# Patient Record
Sex: Male | Born: 1955
Health system: Southern US, Community
[De-identification: ages and names within clinical notes are randomized; demographics above are authoritative.]

## PROBLEM LIST (undated history)

## (undated) DIAGNOSIS — E559 Vitamin D deficiency, unspecified: Secondary | ICD-10-CM

## (undated) DIAGNOSIS — J939 Pneumothorax, unspecified: Secondary | ICD-10-CM

## (undated) DIAGNOSIS — R5383 Other fatigue: Secondary | ICD-10-CM

## (undated) DIAGNOSIS — N3281 Overactive bladder: Secondary | ICD-10-CM

## (undated) DIAGNOSIS — I319 Disease of pericardium, unspecified: Secondary | ICD-10-CM

## (undated) DIAGNOSIS — R972 Elevated prostate specific antigen [PSA]: Secondary | ICD-10-CM

## (undated) DIAGNOSIS — H538 Other visual disturbances: Secondary | ICD-10-CM

## (undated) DIAGNOSIS — I213 ST elevation (STEMI) myocardial infarction of unspecified site: Secondary | ICD-10-CM

## (undated) DIAGNOSIS — I10 Essential (primary) hypertension: Secondary | ICD-10-CM

## (undated) DIAGNOSIS — E785 Hyperlipidemia, unspecified: Secondary | ICD-10-CM

## (undated) DIAGNOSIS — C61 Malignant neoplasm of prostate: Secondary | ICD-10-CM

## (undated) DIAGNOSIS — R3915 Urgency of urination: Secondary | ICD-10-CM

## (undated) DIAGNOSIS — M1990 Unspecified osteoarthritis, unspecified site: Secondary | ICD-10-CM

## (undated) DIAGNOSIS — R079 Chest pain, unspecified: Secondary | ICD-10-CM

## (undated) DIAGNOSIS — Z720 Tobacco use: Secondary | ICD-10-CM

## (undated) DIAGNOSIS — R319 Hematuria, unspecified: Secondary | ICD-10-CM

## (undated) HISTORY — DX: Unspecified osteoarthritis, unspecified site: M19.90

## (undated) HISTORY — DX: Urgency of urination: R39.15

## (undated) HISTORY — DX: ST elevation (STEMI) myocardial infarction of unspecified site: I21.3

## (undated) HISTORY — DX: Essential (primary) hypertension: I10

## (undated) HISTORY — PX: WISDOM TOOTH EXTRACTION: SHX21

## (undated) HISTORY — DX: Hyperlipidemia, unspecified: E78.5

## (undated) HISTORY — PX: PROSTATE BIOPSY: SHX241

## (undated) HISTORY — DX: Tobacco use: Z72.0

## (undated) HISTORY — PX: DENTAL SURGERY: SHX609

## (undated) HISTORY — DX: Hematuria, unspecified: R31.9

## (undated) HISTORY — DX: Chest pain, unspecified: R07.9

## (undated) HISTORY — DX: Overactive bladder: N32.81

## (undated) HISTORY — DX: Other visual disturbances: H53.8

## (undated) HISTORY — DX: Elevated prostate specific antigen (PSA): R97.20

## (undated) HISTORY — DX: Vitamin D deficiency, unspecified: E55.9

## (undated) HISTORY — DX: Other fatigue: R53.83

---

## 1984-03-02 DIAGNOSIS — J939 Pneumothorax, unspecified: Secondary | ICD-10-CM

## 1984-03-02 HISTORY — DX: Pneumothorax, unspecified: J93.9

## 2016-03-11 DIAGNOSIS — Z0000 Encounter for general adult medical examination without abnormal findings: Secondary | ICD-10-CM | POA: Diagnosis not present

## 2016-03-11 DIAGNOSIS — Z136 Encounter for screening for cardiovascular disorders: Secondary | ICD-10-CM | POA: Diagnosis not present

## 2016-03-11 DIAGNOSIS — Z131 Encounter for screening for diabetes mellitus: Secondary | ICD-10-CM | POA: Diagnosis not present

## 2016-03-11 DIAGNOSIS — Z720 Tobacco use: Secondary | ICD-10-CM | POA: Diagnosis not present

## 2016-03-11 DIAGNOSIS — N3281 Overactive bladder: Secondary | ICD-10-CM | POA: Diagnosis not present

## 2016-03-11 DIAGNOSIS — Z01118 Encounter for examination of ears and hearing with other abnormal findings: Secondary | ICD-10-CM | POA: Diagnosis not present

## 2016-03-11 DIAGNOSIS — R319 Hematuria, unspecified: Secondary | ICD-10-CM | POA: Diagnosis not present

## 2016-03-11 DIAGNOSIS — H538 Other visual disturbances: Secondary | ICD-10-CM | POA: Diagnosis not present

## 2016-03-11 DIAGNOSIS — R5383 Other fatigue: Secondary | ICD-10-CM | POA: Diagnosis not present

## 2016-03-11 LAB — LIPID PANEL
Cholesterol: 169 (ref 0–200)
HDL: 50 (ref 35–70)
LDL Cholesterol: 102
Triglycerides: 84 (ref 40–160)

## 2016-03-11 LAB — BASIC METABOLIC PANEL
BUN: 11 (ref 4–21)
Creatinine: 0.8 (ref 0.6–1.3)
GLUCOSE: 79
Potassium: 4.4 (ref 3.4–5.3)
Sodium: 143 (ref 137–147)

## 2016-03-11 LAB — HEPATIC FUNCTION PANEL
ALK PHOS: 73 (ref 25–125)
ALT: 20 (ref 10–40)
AST: 18 (ref 14–40)

## 2016-03-11 LAB — HM HIV SCREENING LAB: HM HIV SCREENING: NEGATIVE

## 2016-03-11 LAB — TSH: TSH: 1.15 (ref 0.41–5.90)

## 2016-03-11 LAB — HM HEPATITIS C SCREENING LAB: HM HEPATITIS C SCREENING: NEGATIVE

## 2016-03-11 LAB — CBC AND DIFFERENTIAL
NEUTROS ABS: 3
WBC: 6.8

## 2016-03-11 LAB — VITAMIN D 25 HYDROXY (VIT D DEFICIENCY, FRACTURES): VIT D 25 HYDROXY: 12.2

## 2016-03-11 LAB — PSA: PSA: 98

## 2016-03-11 LAB — HEMOGLOBIN A1C: HEMOGLOBIN A1C: 5.5

## 2016-04-07 DIAGNOSIS — N3281 Overactive bladder: Secondary | ICD-10-CM | POA: Diagnosis not present

## 2016-04-07 DIAGNOSIS — R972 Elevated prostate specific antigen [PSA]: Secondary | ICD-10-CM | POA: Diagnosis not present

## 2016-04-07 DIAGNOSIS — E785 Hyperlipidemia, unspecified: Secondary | ICD-10-CM | POA: Diagnosis not present

## 2016-04-07 DIAGNOSIS — I10 Essential (primary) hypertension: Secondary | ICD-10-CM | POA: Diagnosis not present

## 2016-04-07 DIAGNOSIS — E559 Vitamin D deficiency, unspecified: Secondary | ICD-10-CM | POA: Diagnosis not present

## 2016-04-07 DIAGNOSIS — Z720 Tobacco use: Secondary | ICD-10-CM | POA: Diagnosis not present

## 2016-08-28 ENCOUNTER — Encounter (HOSPITAL_COMMUNITY)
Admission: EM | Disposition: A | Discharge status: Home / Self Care | Payer: Self-pay | Source: Home / Self Care | Attending: Cardiology

## 2016-08-28 ENCOUNTER — Emergency Department (HOSPITAL_COMMUNITY): Payer: Medicare HMO

## 2016-08-28 ENCOUNTER — Observation Stay (HOSPITAL_COMMUNITY): Payer: Medicare HMO

## 2016-08-28 ENCOUNTER — Encounter (HOSPITAL_COMMUNITY): Payer: Self-pay | Admitting: Emergency Medicine

## 2016-08-28 ENCOUNTER — Observation Stay (HOSPITAL_BASED_OUTPATIENT_CLINIC_OR_DEPARTMENT_OTHER): Payer: Medicare HMO

## 2016-08-28 ENCOUNTER — Observation Stay (HOSPITAL_COMMUNITY)
Admission: EM | Admit: 2016-08-28 | Discharge: 2016-08-28 | Disposition: A | Discharge status: Home / Self Care | Payer: Medicare HMO | Attending: Cardiology | Admitting: Cardiology

## 2016-08-28 DIAGNOSIS — R079 Chest pain, unspecified: Secondary | ICD-10-CM | POA: Diagnosis not present

## 2016-08-28 DIAGNOSIS — I200 Unstable angina: Secondary | ICD-10-CM

## 2016-08-28 DIAGNOSIS — R69 Illness, unspecified: Secondary | ICD-10-CM | POA: Diagnosis not present

## 2016-08-28 DIAGNOSIS — I319 Disease of pericardium, unspecified: Principal | ICD-10-CM

## 2016-08-28 DIAGNOSIS — I213 ST elevation (STEMI) myocardial infarction of unspecified site: Secondary | ICD-10-CM | POA: Diagnosis present

## 2016-08-28 DIAGNOSIS — F17210 Nicotine dependence, cigarettes, uncomplicated: Secondary | ICD-10-CM | POA: Diagnosis not present

## 2016-08-28 DIAGNOSIS — R072 Precordial pain: Secondary | ICD-10-CM

## 2016-08-28 DIAGNOSIS — R7989 Other specified abnormal findings of blood chemistry: Secondary | ICD-10-CM

## 2016-08-28 HISTORY — PX: LEFT HEART CATH AND CORONARY ANGIOGRAPHY: CATH118249

## 2016-08-28 HISTORY — DX: Pneumothorax, unspecified: J93.9

## 2016-08-28 HISTORY — DX: Disease of pericardium, unspecified: I31.9

## 2016-08-28 HISTORY — DX: Chest pain, unspecified: R07.9

## 2016-08-28 HISTORY — DX: ST elevation (STEMI) myocardial infarction of unspecified site: I21.3

## 2016-08-28 LAB — BASIC METABOLIC PANEL
Anion gap: 8 (ref 5–15)
Anion gap: 8 (ref 5–15)
BUN: 14 mg/dL (ref 6–20)
BUN: 10 mg/dL (ref 6–20)
CALCIUM: 9.2 mg/dL (ref 8.9–10.3)
CO2: 24 mmol/L (ref 22–32)
CO2: 22 mmol/L (ref 22–32)
CREATININE: 0.88 mg/dL (ref 0.61–1.24)
CREATININE: 0.74 mg/dL (ref 0.61–1.24)
Calcium: 9.1 mg/dL (ref 8.9–10.3)
Chloride: 109 mmol/L (ref 101–111)
Chloride: 108 mmol/L (ref 101–111)
GFR calc non Af Amer: >60 mL/min (ref >60)
GFR calc non Af Amer: >60 mL/min (ref >60)
Glucose, Bld: 110 mg/dL — ABNORMAL HIGH (ref 65–99)
Glucose, Bld: 112 mg/dL — ABNORMAL HIGH (ref 65–99)
POTASSIUM: 3.8 mmol/L (ref 3.5–5.1)
Potassium: 4.2 mmol/L (ref 3.5–5.1)
SODIUM: 141 mmol/L (ref 135–145)
SODIUM: 138 mmol/L (ref 135–145)

## 2016-08-28 LAB — CBC
HCT: 42.7 % (ref 39.0–52.0)
HEMATOCRIT: 40.6 % (ref 39.0–52.0)
Hemoglobin: 14.7 g/dL (ref 13.0–17.0)
Hemoglobin: 13.9 g/dL (ref 13.0–17.0)
MCH: 32 pg (ref 26.0–34.0)
MCH: 32.5 pg (ref 26.0–34.0)
MCHC: 34.4 g/dL (ref 30.0–36.0)
MCHC: 34.2 g/dL (ref 30.0–36.0)
MCV: 92.8 fL (ref 78.0–100.0)
MCV: 94.9 fL (ref 78.0–100.0)
PLATELETS: 255 10*3/uL (ref 150–400)
Platelets: 229 10*3/uL (ref 150–400)
RBC: 4.6 MIL/uL (ref 4.22–5.81)
RBC: 4.28 MIL/uL (ref 4.22–5.81)
RDW: 14.3 % (ref 11.5–15.5)
RDW: 14.5 % (ref 11.5–15.5)
WBC: 11.4 10*3/uL — ABNORMAL HIGH (ref 4.0–10.5)
WBC: 10.4 10*3/uL (ref 4.0–10.5)

## 2016-08-28 LAB — ECHOCARDIOGRAM COMPLETE
Height: 75 in
Weight: 3520 oz

## 2016-08-28 LAB — C-REACTIVE PROTEIN: CRP: 1.1 mg/dL — ABNORMAL HIGH (ref <1.0)

## 2016-08-28 LAB — CK TOTAL AND CKMB (NOT AT ARMC)
CK, MB: 1.4 ng/mL (ref 0.5–5.0)
Relative Index: 1.1 (ref 0.0–2.5)
Total CK: 132 U/L (ref 49–397)

## 2016-08-28 LAB — SEDIMENTATION RATE: Sed Rate: 24 mm/hr — ABNORMAL HIGH (ref 0–16)

## 2016-08-28 LAB — PROTIME-INR
INR: 0.93
Prothrombin Time: 12.5 seconds (ref 11.4–15.2)

## 2016-08-28 LAB — LIPID PANEL
CHOL/HDL RATIO: 3.2 ratio
Cholesterol: 172 mg/dL (ref 0–200)
HDL: 53 mg/dL (ref >40)
LDL CALC: 106 mg/dL — AB (ref 0–99)
TRIGLYCERIDES: 63 mg/dL (ref <150)
VLDL: 13 mg/dL (ref 0–40)

## 2016-08-28 LAB — I-STAT TROPONIN, ED: TROPONIN I, POC: 0 ng/mL (ref 0.00–0.08)

## 2016-08-28 LAB — POCT ACTIVATED CLOTTING TIME: Activated Clotting Time: 158 seconds

## 2016-08-28 LAB — D-DIMER, QUANTITATIVE (NOT AT ARMC): D DIMER QUANT: 15.41 ug{FEU}/mL — AB (ref 0.00–0.50)

## 2016-08-28 LAB — TROPONIN I
Troponin I: <0.03 ng/mL (ref <0.03)
Troponin I: <0.03 ng/mL (ref <0.03)

## 2016-08-28 LAB — HIV ANTIBODY (ROUTINE TESTING W REFLEX): HIV SCREEN 4TH GENERATION: NONREACTIVE

## 2016-08-28 LAB — APTT: aPTT: 37 seconds — ABNORMAL HIGH (ref 24–36)

## 2016-08-28 SURGERY — LEFT HEART CATH AND CORONARY ANGIOGRAPHY
Anesthesia: LOCAL

## 2016-08-28 MED ORDER — SODIUM CHLORIDE 0.9% FLUSH: 3.0000 mL | Freq: Two times a day (BID) | INTRAVENOUS | Status: DC

## 2016-08-28 MED ORDER — ONDANSETRON HCL 4 MG/2ML IJ SOLN: 4.0000 mg | Freq: Four times a day (QID) | INTRAMUSCULAR | Status: DC | PRN

## 2016-08-28 MED ORDER — NICOTINE 14 MG/24HR TD PT24
14.0000 mg | MEDICATED_PATCH | Freq: Every day | TRANSDERMAL | Status: DC
Start: 2016-08-28 — End: 2016-08-28
  Administered 2016-08-28: 14 mg via TRANSDERMAL
  Filled 2016-08-28: qty 1

## 2016-08-28 MED ORDER — IOPAMIDOL (ISOVUE-370) INJECTION 76%
INTRAVENOUS | Status: AC
Start: 2016-08-28 — End: ?
  Filled 2016-08-28: qty 125

## 2016-08-28 MED ORDER — MIDAZOLAM HCL 2 MG/2ML IJ SOLN
INTRAMUSCULAR | Status: DC | PRN
  Administered 2016-08-28: 2 mg via INTRAVENOUS

## 2016-08-28 MED ORDER — PANTOPRAZOLE SODIUM 40 MG PO TBEC
40.0000 mg | DELAYED_RELEASE_TABLET | Freq: Every day | ORAL | Status: DC
  Administered 2016-08-28: 40 mg via ORAL
  Filled 2016-08-28: qty 1

## 2016-08-28 MED ORDER — MIDAZOLAM HCL 2 MG/2ML IJ SOLN
INTRAMUSCULAR | Status: AC
  Filled 2016-08-28: qty 2

## 2016-08-28 MED ORDER — NICOTINE 7 MG/24HR TD PT24
7.0000 mg | MEDICATED_PATCH | Freq: Every day | TRANSDERMAL | Status: DC
  Filled 2016-08-28: qty 1

## 2016-08-28 MED ORDER — COLCHICINE 0.6 MG PO TABS: 0.6000 mg | ORAL_TABLET | Freq: Two times a day (BID) | ORAL | 3 refills | Status: DC

## 2016-08-28 MED ORDER — FENTANYL CITRATE (PF) 100 MCG/2ML IJ SOLN
INTRAMUSCULAR | Status: DC | PRN
  Administered 2016-08-28: 25 ug via INTRAVENOUS

## 2016-08-28 MED ORDER — HEPARIN SODIUM (PORCINE) 5000 UNIT/ML IJ SOLN
4000.0000 [IU] | Freq: Once | INTRAMUSCULAR | Status: AC
  Administered 2016-08-28: 4000 [IU] via INTRAVENOUS
  Filled 2016-08-28: qty 0.8

## 2016-08-28 MED ORDER — TECHNETIUM TO 99M ALBUMIN AGGREGATED
4.0000 | Freq: Once | INTRAVENOUS | Status: AC | PRN
  Administered 2016-08-28: 4 via INTRAVENOUS

## 2016-08-28 MED ORDER — NITROGLYCERIN 2 % TD OINT
1.0000 [in_us] | TOPICAL_OINTMENT | Freq: Once | TRANSDERMAL | Status: AC
  Administered 2016-08-28: 1 [in_us] via TOPICAL
  Filled 2016-08-28: qty 2

## 2016-08-28 MED ORDER — SODIUM CHLORIDE 0.9% FLUSH
3.0000 mL | Freq: Two times a day (BID) | INTRAVENOUS | Status: DC
  Administered 2016-08-28: 3 mL via INTRAVENOUS

## 2016-08-28 MED ORDER — IOPAMIDOL (ISOVUE-370) INJECTION 76%
INTRAVENOUS | Status: DC | PRN
  Administered 2016-08-28: 75 mL via INTRA_ARTERIAL

## 2016-08-28 MED ORDER — SODIUM CHLORIDE 0.9 % IV SOLN
INTRAVENOUS | Status: DC
  Administered 2016-08-28: 03:00:00 via INTRAVENOUS

## 2016-08-28 MED ORDER — FENTANYL CITRATE (PF) 100 MCG/2ML IJ SOLN
INTRAMUSCULAR | Status: AC
  Filled 2016-08-28: qty 2

## 2016-08-28 MED ORDER — VERAPAMIL HCL 2.5 MG/ML IV SOLN
INTRAVENOUS | Status: DC | PRN
  Administered 2016-08-28: 10 mL via INTRA_ARTERIAL

## 2016-08-28 MED ORDER — IBUPROFEN 800 MG PO TABS
800.0000 mg | ORAL_TABLET | Freq: Three times a day (TID) | ORAL | Status: DC
  Administered 2016-08-28 (×2): 800 mg via ORAL
  Filled 2016-08-28 (×2): qty 1

## 2016-08-28 MED ORDER — OXYCODONE-ACETAMINOPHEN 5-325 MG PO TABS: 1.0000 | ORAL_TABLET | ORAL | Status: DC | PRN

## 2016-08-28 MED ORDER — SODIUM CHLORIDE 0.9 % IV SOLN: 250.0000 mL | INTRAVENOUS | Status: DC | PRN

## 2016-08-28 MED ORDER — IBUPROFEN 800 MG PO TABS: 800.0000 mg | ORAL_TABLET | Freq: Three times a day (TID) | ORAL | 0 refills | Status: DC

## 2016-08-28 MED ORDER — SODIUM CHLORIDE 0.9 % IV SOLN
INTRAVENOUS | Status: AC
  Administered 2016-08-28: 75 mL/h via INTRAVENOUS

## 2016-08-28 MED ORDER — VERAPAMIL HCL 2.5 MG/ML IV SOLN
INTRAVENOUS | Status: AC
  Filled 2016-08-28: qty 2

## 2016-08-28 MED ORDER — ACETAMINOPHEN 325 MG PO TABS: 650.0000 mg | ORAL_TABLET | ORAL | Status: DC | PRN

## 2016-08-28 MED ORDER — ASPIRIN 81 MG PO CHEW
324.0000 mg | CHEWABLE_TABLET | Freq: Once | ORAL | Status: AC
  Administered 2016-08-28: 324 mg via ORAL
  Filled 2016-08-28: qty 4

## 2016-08-28 MED ORDER — SODIUM CHLORIDE 0.9% FLUSH: 3.0000 mL | INTRAVENOUS | Status: DC | PRN

## 2016-08-28 MED ORDER — COLCHICINE 0.6 MG PO TABS
0.6000 mg | ORAL_TABLET | Freq: Two times a day (BID) | ORAL | Status: DC
  Administered 2016-08-28: 0.6 mg via ORAL
  Filled 2016-08-28: qty 1

## 2016-08-28 MED ORDER — HEPARIN (PORCINE) IN NACL 2-0.9 UNIT/ML-% IJ SOLN
INTRAMUSCULAR | Status: AC | PRN
  Administered 2016-08-28: 1000 mL

## 2016-08-28 MED ORDER — LIDOCAINE HCL (PF) 1 % IJ SOLN
INTRAMUSCULAR | Status: AC
  Filled 2016-08-28: qty 30

## 2016-08-28 MED ORDER — LIDOCAINE HCL (PF) 1 % IJ SOLN
INTRAMUSCULAR | Status: DC | PRN
  Administered 2016-08-28: 18 mL
  Administered 2016-08-28: 2 mL

## 2016-08-28 MED ORDER — PANTOPRAZOLE SODIUM 40 MG PO TBEC: 40.0000 mg | DELAYED_RELEASE_TABLET | Freq: Every day | ORAL | 3 refills | Status: DC

## 2016-08-28 MED ORDER — ACETAMINOPHEN 325 MG PO TABS
650.0000 mg | ORAL_TABLET | ORAL | Status: DC | PRN
Start: 2016-08-28 — End: 2016-08-28

## 2016-08-28 MED ORDER — HEPARIN (PORCINE) IN NACL 2-0.9 UNIT/ML-% IJ SOLN
INTRAMUSCULAR | Status: AC
  Filled 2016-08-28: qty 1000

## 2016-08-28 MED ORDER — TECHNETIUM TC 99M DIETHYLENETRIAME-PENTAACETIC ACID: 30.0000 | Freq: Once | INTRAVENOUS | Status: DC | PRN

## 2016-08-28 SURGICAL SUPPLY — 15 items
CATH 5FR JL3.5 JR4 ANG PIG MP (CATHETERS) ×2 IMPLANT
CATH INFINITI 5FR JL4 (CATHETERS) ×2 IMPLANT
DEVICE RAD COMP TR BAND LRG (VASCULAR PRODUCTS) ×2 IMPLANT
GLIDESHEATH SLEND SS 6F .021 (SHEATH) ×2 IMPLANT
GUIDEWIRE INQWIRE 1.5J.035X260 (WIRE) ×1 IMPLANT
INQWIRE 1.5J .035X260CM (WIRE) ×2
KIT ENCORE 26 ADVANTAGE (KITS) ×2 IMPLANT
KIT HEART LEFT (KITS) ×2 IMPLANT
PACK CARDIAC CATHETERIZATION (CUSTOM PROCEDURE TRAY) ×2 IMPLANT
PAD ELECT DEFIB RADIOL ZOLL (MISCELLANEOUS) ×2 IMPLANT
SHEATH PINNACLE 5F 10CM (SHEATH) ×2 IMPLANT
SYR MEDRAD MARK V 150ML (SYRINGE) ×2 IMPLANT
TRANSDUCER W/STOPCOCK (MISCELLANEOUS) ×2 IMPLANT
TUBING CIL FLEX 10 FLL-RA (TUBING) ×2 IMPLANT
WIRE EMERALD 3MM-J .035X150CM (WIRE) ×2 IMPLANT

## 2016-08-28 NOTE — ED Notes (Signed)
Report to Morgan/Charge RN at [facility] ED-Report to [facility]/5 minutes out

## 2016-08-28 NOTE — Progress Notes (Signed)
Discharge instructions reviewed with pt. Pt has no questions at this time. Pt denies any pain and states he is ready to be discharged. Radial site level 0.

## 2016-08-28 NOTE — Progress Notes (Signed)
Responded to page for STEMI transfer from [facility], who went straight to Cath lab, then 3W. No family were present. Day chaplain willl f/u.   08/28/16 0300  Clinical Encounter Type  Visited With Patient not available  Referral From Nurse   Gerrit Heck, Chaplain

## 2016-08-28 NOTE — H&P (Signed)
CARDIOLOGY HISTORY & PHYSICAL   Referring Physician: Dr. Guy Begin [facility] Primary Physician: Primary Cardiologist: N/A Reason for Admission: STEMI   HPI: [name] is a 61 man who presented to the [facility] ER with two hours of constant chest, shoulder and back tightness. Has never had before. Worse with deep breathing, associated with shortness of breath. Worsened with lying flat. Radiates to bilateral arms and shoulders, worst in the upper right shoulder area. No nausea, vomiting, diaphoresis. No swelling in legs. Has never had symptoms to this extent before. Did have pneumothorax 20 years ago, resolved. Risk factor is tobacco use. No prior history of heart disease. Does not take any medications. No other PMH.  Does note several days of cold symptoms.  Review of Systems:     Cardiac Review of Systems: {Y] = yes [ ] = no  Chest Pain [ Y   ]  Resting SOB [  Y ] Exertional SOB  [  ]  Orthopnea [  ]   Pedal Edema [   ]    Palpitations [  ] Syncope  [  ]   Presyncope [   ]  General Review of Systems: [Y] = yes [  ]=no Constitional: recent weight change [  ]; anorexia [  ]; fatigue [  ]; nausea [  ]; night sweats [  ]; fever [  ]; or chills [  ];                                                                     Eyes : blurred vision [  ]; diplopia [   ]; vision changes [  ];  Amaurosis fugax[  ]; Resp: cough [  ];  wheezing[  ];  hemoptysis[  ];  PND [  ];  GI:  gallstones[  ], vomiting[  ];  dysphagia[  ]; melena[  ];  hematochezia [  ]; heartburn[  ];   GU: kidney stones [  ]; hematuria[  ];   dysuria [  ];  nocturia[  ]; incontinence [  ];             Skin: rash, swelling[  ];, hair loss[  ];  peripheral edema[  ];  or itching[  ]; Musculosketetal: myalgias[ Y ];  joint swelling[  ];  joint erythema[  ];  joint pain[  ];  back pain[  ];  Heme/Lymph: bruising[  ];  bleeding[  ];  anemia[  ];  Neuro: TIA[  ];  headaches[  ];  stroke[  ];  vertigo[  ];  seizures[  ];    paresthesias[  ];  difficulty walking[  ];  Psych:depression[  ]; anxiety[  ];  Endocrine: diabetes[  ];  thyroid dysfunction[  ];  Other:  Past Medical History:  Diagnosis Date  . Pneumothorax     No prescriptions prior to admission.    Infusions: . sodium chloride 20 mL/hr at 08/28/16 0316    No Known Allergies  Social History   Social History  . Marital status: Single    Spouse name: N/A  . Number of children: N/A  . Years of education: N/A   Occupational History  . Not on file.   Social  History Main Topics  . Smoking status: Current Every Day Smoker    Packs/day: 0.25  . Smokeless tobacco: Never Used  . Alcohol use No  . Drug use: No  . Sexual activity: Not on file   Other Topics Concern  . Not on file   Social History Narrative  . No narrative on file    Family History  Problem Relation Age of Onset  . Hypertension Mother   . Stroke Father     PHYSICAL EXAM: Vitals:   08/28/16 0325 08/28/16 0330  BP:  (!) 141/93  Pulse: 71 73  Resp: (!) 27 (!) 22    No intake or output data in the 24 hours ending 08/28/16 0358  General:  Well appearing. No respiratory difficulty HEENT: normal, nasal cannula in place Neck: supple. no JVD. Carotids 2+ bilat. No lymphadenopathy or thryomegaly appreciated. Cor: PMI nondisplaced. Regular rate & rhythm. No rubs, gallops or murmurs. Lungs: clear Abdomen: soft, nontender, nondistended. No hepatosplenomegaly. No bruits or masses. Good bowel sounds. Extremities: no cyanosis, clubbing, rash, edema Neuro: alert & oriented x 3, cranial nerves grossly intact. moves all 4 extremities w/o difficulty. Affect pleasant.  ECG: sinus rhythm, concave up ST elevations in II, III, aVF, V5, V6 of 1 mm  Results for orders placed or performed during the hospital encounter of 08/28/16 (from the past 24 hour(s))  Basic metabolic panel     Status: Abnormal   Collection Time: 08/28/16  3:01 AM  Result Value Ref Range   Sodium 141 135  - 145 mmol/L   Potassium 4.2 3.5 - 5.1 mmol/L   Chloride 109 101 - 111 mmol/L   CO2 24 22 - 32 mmol/L   Glucose, Bld 110 (H) 65 - 99 mg/dL   BUN 14 6 - 20 mg/dL   Creatinine, Ser 0.88 0.61 - 1.24 mg/dL   Calcium 9.2 8.9 - 10.3 mg/dL   GFR calc non Af Amer >60 >60 mL/min   GFR calc Af Amer >60 >60 mL/min   Anion gap 8 5 - 15  CBC     Status: Abnormal   Collection Time: 08/28/16  3:01 AM  Result Value Ref Range   WBC 11.4 (H) 4.0 - 10.5 K/uL   RBC 4.60 4.22 - 5.81 MIL/uL   Hemoglobin 14.7 13.0 - 17.0 g/dL   HCT 42.7 39.0 - 52.0 %   MCV 92.8 78.0 - 100.0 fL   MCH 32.0 26.0 - 34.0 pg   MCHC 34.4 30.0 - 36.0 g/dL   RDW 14.3 11.5 - 15.5 %   Platelets 255 150 - 400 K/uL  Protime-INR     Status: None   Collection Time: 08/28/16  3:01 AM  Result Value Ref Range   Prothrombin Time 12.5 11.4 - 15.2 seconds   INR 0.93   APTT     Status: Abnormal   Collection Time: 08/28/16  3:01 AM  Result Value Ref Range   aPTT 37 (H) 24 - 36 seconds  Troponin I     Status: None   Collection Time: 08/28/16  3:01 AM  Result Value Ref Range   Troponin I <0.03 <0.03 ng/mL  I-stat troponin, ED     Status: None   Collection Time: 08/28/16  3:14 AM  Result Value Ref Range   Troponin i, poc 0.00 0.00 - 0.08 ng/mL   Comment 3           No results found.   ASSESSMENT: 61 yo man with two hours  of chest pain. No prior ECG, has  1 mm concave up ST elevations in inferolateral leads. No clear reciprocal depressions. Concerning history for acute MI. Most important to evaluate ACS first. If coronaries clean, consider pericarditis or pleuritis as possible etiology. Other DDx includes dissection, pneumothorax, PE--given good O2 sats, no tachycardia, Aortic pressure of 124/77 and findings below, low suspicion for these. Marland Kitchen  PLAN/DISCUSSION:  STEMI -received 324 mg aspirin and 4000 units of heparin prior to arrival at [facility]. Nitro patch in place without relief of symptoms -emergent cardiac catheterization:  no coronary artery disease -chest X ray (machine down at [facility] [employer] [facility]) -echo in AM (esp for fluid given concern for pericarditis) -monitor access sites (radial and femoral)  Given no evidence of obstructive disease of coronaries, suspect pericarditis as likely cause. V-gram shot into aortic root with no evidence of dissection.  Suspected Pericarditis: -ibuprofen 800 mg three times daily -colchicine 0.6 mg twice daily -PPI -check ESR/CRP -initial biomarkers negative, will order set to eval myopericarditis  Will get d-dimer to exclude PE as well  Buford Dresser, cardiology night MD

## 2016-08-28 NOTE — Discharge Summary (Signed)
Discharge Summary    Patient ID: Aaron Adkins,  MRN: [id number], DOB/AGE: 61-Jan-1957 61 y.o.  Admit date: 08/28/2016 Discharge date: 08/28/2016  Primary Care Provider: No primary care provider on file. Primary Cardiologist: New to [facility] - Dr. Angelena Form  Discharge Diagnoses    Active Problems:   Chest pain   Electrocardiogram suggestive of ST elevation myocardial infarction (STEMI) [facility])   Pericarditis   History of Present Illness     Aaron Adkins is a 61 y.o. male with no prior cardiac history but known tobacco use who presented to [facility] ED on 08/28/2016 for evaluation of chest pain.  He reported initial onset of his symptoms occurring two hours prior to his arrival. Noted his pain was worse with deep breathing and lying flat. Denied any associated nausea, vomiting, and diaphoresis. Did report having "cold-like" symptoms the days leading up to his presentation. EKG showed inferolateral ST elevation, therefore he was taken for an emergent cardiac catheterization.   Hospital Course     Consultants: None   His cardiac catheterization showed no angiographic evidence of CAD, therefore his presentation with ST elevation was thought to be most consistent with pericarditis. CXR showed no active disease. Echo showed an EF of 60-65%, Grade 1 DD, trivial AI, and no evidence of a pericardial effusion. Troponin was negative. CRP was elevated at 1.1 with Sed Rate of 24. D-dimer was elevated to 15.41. A VQ Scan was obtained which showed no evidence of a PE. He denied any recent travel or leg pain.   He will be on high-dose NSAIDS for two weeks along with Colchicine for 3 months. Was provided with Rx for Protonix for GI protection. 2-week Cardiology follow-up has been arranged. He was last examined by Dr. Oval Linsey and deemed stable for discharge.   _____________  Discharge Vitals Blood pressure 126/88, pulse 63, temperature 98.1 F (36.7 C), temperature source Oral, resp. rate (!) 21,  height 6\' 3"  (1.905 m), weight 220 lb (99.8 kg), SpO2 97 %.  Filed Weights   08/28/16 0259 08/28/16 0512  Weight: 220 lb (99.8 kg) 220 lb (99.8 kg)    Labs & Radiologic Studies     CBC  Recent Labs  08/28/16 0301 08/28/16 0728  WBC 11.4* 10.4  HGB 14.7 13.9  HCT 42.7 40.6  MCV 92.8 94.9  PLT 255 578   Basic Metabolic Panel  Recent Labs  08/28/16 0301 08/28/16 0728  NA 141 138  K 4.2 3.8  CL 109 108  CO2 24 22  GLUCOSE 110* 112*  BUN 14 10  CREATININE 0.88 0.74  CALCIUM 9.2 9.1   Liver Function Tests No results for input(s): AST, ALT, ALKPHOS, BILITOT, PROT, ALBUMIN in the last 72 hours. No results for input(s): LIPASE, AMYLASE in the last 72 hours. Cardiac Enzymes  Recent Labs  08/28/16 0301 08/28/16 0728  CKTOTAL  --  132  CKMB  --  1.4  TROPONINI <0.03 <0.03   BNP Invalid input(s): POCBNP D-Dimer  Recent Labs  08/28/16 0728  DDIMER 15.41*   Hemoglobin A1C No results for input(s): HGBA1C in the last 72 hours. Fasting Lipid Panel  Recent Labs  08/28/16 0304  CHOL 172  HDL 53  LDLCALC 106*  TRIG 63  CHOLHDL 3.2    Dg Chest Port 1 View: Result Date: 08/28/2016 CLINICAL DATA:  Shortness breath and chest pain EXAM: PORTABLE CHEST 1 VIEW COMPARISON:  None. FINDINGS: Cardiac shadow is at the upper limits of normal in size. The  lungs are well aerated bilaterally. No focal infiltrate or sizable effusion is seen. No pneumothorax is noted. No acute bony abnormality is seen. IMPRESSION: No active disease. Electronically Signed   By: Inez Catalina M.D.   On: 08/28/2016 07:32    Diagnostic Studies/Procedures     Cardiac Catheterization: 08/28/2016  The left ventricular systolic function is normal.  LV end diastolic pressure is normal.  The left ventricular ejection fraction is greater than 65% by visual estimate.  There is no mitral valve regurgitation.   1. No angiographic evidence of CAD 2. Normal LV systolic function 3. No evidence of  ascending aorta dissection  Recommendations: Will admit to telemetry unit. Will arrange chest x-ray to exclude pneumothorax given his history of prior pneumothorax. Will arrange echo to exclude pericardial effusion given pleuritic chest pain and EKG pattern of ST elevation c/w pericarditis. Tobacco cessation. Cardiac monitoring x 24 hours.     Echocardiogram: 08/28/2016 Study Conclusions  - Left ventricle: The cavity size was normal. There was moderate   concentric hypertrophy. Systolic function was normal. The   estimated ejection fraction was in the range of 60% to 65%. Wall   motion was normal; there were no regional wall motion   abnormalities. Doppler parameters are consistent with abnormal   left ventricular relaxation (grade 1 diastolic dysfunction).   There was no evidence of elevated ventricular filling pressure by   Doppler parameters. - Aortic valve: There was trivial regurgitation. - Right ventricle: The cavity size was normal. Wall thickness was   normal. Systolic function was normal. - Right atrium: The atrium was normal in size. - Tricuspid valve: There was mild regurgitation. - Pulmonary arteries: Systolic pressure was within the normal   range. - Inferior vena cava: The vessel was normal in size. - Pericardium, extracardiac: There was no pericardial effusion.    Disposition   Pt is being discharged home today in good condition.  Follow-up Plans & Appointments    Follow-up Information    Consuelo Pandy, PA-C Follow up on 09/14/2016.   Specialties:  Cardiology, Radiology Why:  Cardiology Hospital Follow-up on 09/14/2016 at 3:00PM.  Contact information: [address] [place] [id number] [phone]          Discharge Instructions    Discharge instructions    Complete by:  As directed    PLEASE REMEMBER TO BRING ALL OF YOUR MEDICATIONS TO EACH OF YOUR FOLLOW-UP OFFICE VISITS.  PLEASE ATTEND ALL SCHEDULED FOLLOW-UP APPOINTMENTS.    Activity: Increase activity slowly as tolerated. You may shower, but no soaking baths (or swimming) for 1 week. No driving for 24 hours. No lifting over 5 lbs for 1 week. No sexual activity for 1 week.   You May Return to Work: in 1 week (if applicable)  Wound Care: You may wash cath site gently with soap and water. Keep cath site clean and dry. If you notice pain, swelling, bleeding or pus at your cath site, please call [phone].   Increase activity slowly    Complete by:  As directed       Discharge Medications     Medication List    TAKE these medications   CENTRUM PO Take 1 tablet by mouth daily.   colchicine 0.6 MG tablet Take 1 tablet (0.6 mg total) by mouth 2 (two) times daily.   ibuprofen 800 MG tablet Commonly known as:  ADVIL,MOTRIN Take 1 tablet (800 mg total) by mouth 3 (three) times daily.   pantoprazole  40 MG tablet Commonly known as:  PROTONIX Take 1 tablet (40 mg total) by mouth daily. Start taking on:  08/29/2016   PROSTATE PO Take 1 tablet by mouth daily. Multivitamin         Allergies No Known Allergies   Outstanding Labs/Studies   None  Duration of Discharge Encounter   Greater than 30 minutes including physician time.  Signed, Erma Heritage, PA-C 08/28/2016, 5:41 PM

## 2016-08-28 NOTE — Progress Notes (Signed)
Progress Note  Patient Name: Aaron Adkins Date of Encounter: 08/28/2016  Primary Cardiologist: Dr. Julianne Handler (new)  Subjective   Feeling well. Denies chest pain or shortness of breath.   Scheduled Meds: . colchicine  0.6 mg Oral BID  . ibuprofen  800 mg Oral TID  . nicotine  7 mg Transdermal Daily  . pantoprazole  40 mg Oral Daily  . sodium chloride flush  3 mL Intravenous Q12H  . sodium chloride flush  3 mL Intravenous Q12H   Continuous Infusions: . sodium chloride 75 mL/hr (08/28/16 0636)  . sodium chloride     PRN Meds: sodium chloride, acetaminophen, ondansetron (ZOFRAN) IV, oxyCODONE-acetaminophen, sodium chloride flush   Vital Signs    Vitals:   08/28/16 0540 08/28/16 0555 08/28/16 0610 08/28/16 0817  BP: 138/84 140/76 132/83 133/81  Pulse: 67 64 (!) 59   Resp: (!) 21 20 (!) 21   Temp:    98.4 F (36.9 C)  TempSrc:    Oral  SpO2: 98% 99% 97% 96%  Weight:      Height:        Intake/Output Summary (Last 24 hours) at 08/28/16 0854 Last data filed at 08/28/16 0828  Gross per 24 hour  Intake            63.33 ml  Output              350 ml  Net          -286.67 ml   Filed Weights   08/28/16 0259 08/28/16 0512  Weight: 99.8 kg (220 lb) 99.8 kg (220 lb)    Telemetry    Sinus rhythm. No events - Personally Reviewed  ECG    Sinus rhythm. Rate 68 bpm. <62m of inferolateral ST elevation  - Personally Reviewed  Physical Exam   GEN: Well-appearing.  No acute distress.   Neck: No JVD Cardiac: RRR, no murmurs, rubs, or gallops.  Respiratory: Clear to auscultation bilaterally.  No crackles, wheezes or rhonchi  GI: Soft, nontender, non-distended  MS: No edema; No deformity. Neuro:  Nonfocal  Psych: Normal affect   Labs    Chemistry Recent Labs Lab 08/28/16 0301  NA 141  K 4.2  CL 109  CO2 24  GLUCOSE 110*  BUN 14  CREATININE 0.88  CALCIUM 9.2  GFRNONAA >60  GFRAA >60  ANIONGAP 8     Hematology Recent Labs Lab 08/28/16 0301  08/28/16 0728  WBC 11.4* 10.4  RBC 4.60 4.28  HGB 14.7 13.9  HCT 42.7 40.6  MCV 92.8 94.9  MCH 32.0 32.5  MCHC 34.4 34.2  RDW 14.3 14.5  PLT 255 229    Cardiac Enzymes Recent Labs Lab 08/28/16 0301  TROPONINI <0.03    Recent Labs Lab 08/28/16 0314  TROPIPOC 0.00     BNPNo results for input(s): BNP, PROBNP in the last 168 hours.   DDimer No results for input(s): DDIMER in the last 168 hours.   Radiology    Dg Chest Port 1 View  Result Date: 08/28/2016 CLINICAL DATA:  Shortness breath and chest pain EXAM: PORTABLE CHEST 1 VIEW COMPARISON:  None. FINDINGS: Cardiac shadow is at the upper limits of normal in size. The lungs are well aerated bilaterally. No focal infiltrate or sizable effusion is seen. No pneumothorax is noted. No acute bony abnormality is seen. IMPRESSION: No active disease. Electronically Signed   By: MInez CatalinaM.D.   On: 08/28/2016 07:32    Cardiac Studies  LHC 08/28/16: The left ventricular systolic function is normal.  LV end diastolic pressure is normal.  The left ventricular ejection fraction is greater than 65% by visual estimate.  There is no mitral valve regurgitation.   1. No angiographic evidence of CAD 2. Normal LV systolic function 3. No evidence of ascending aorta dissection  Echo pending  Patient Profile     61 y.o. male with tobacco abuse who presented with sudden onset of chest pain. Initially thought to be a STEMI but noted to have normal coronary arteries on cath.   Assessment & Plan    # Presumed pericarditis: Mr. Hennes was initially thought to have a STEMI.  he was taken to the cardiac catheterization laboratory he was found to have no disease. He was then presumed to have pericarditis. He has no evidence of a pericardial friction rub on exam. Cardiac enzymes have remained negative. He does not report a history of recent infection.  ESR, CRP, d-dimer and echo are pending.  I suspect that his EKG may be normal variant.   There is little harm to treating for pericarditis.  We will give a two week course of colchicine and ibuprofen.   # Tobacco abuse: Nicotine patches.   # Dispo: Likely discharge home today.   Signed, Skeet Latch, MD  08/28/2016, 8:54 AM

## 2016-08-28 NOTE — Plan of Care (Signed)
Problem: Pain Managment: Goal: General experience of comfort will improve Outcome: Progressing No c/o pain or discomfort. Encouraged to notify the nurse when in pain

## 2016-08-28 NOTE — ED Triage Notes (Signed)
Pt states he started having pain about 2 hrs ago and some shortness of breath  Pt states the pain was across both shoulder blades and in his chest but now is mainly in his right shoulder and neck and his right chest area  Pt states it is hard to get a good breath  Denies N/V

## 2016-08-28 NOTE — ED Provider Notes (Addendum)
[facility] DEPT Provider Note   CSN: [id number] Arrival date & time: 08/28/16  6213  By signing my name below, I, Mayer Masker, attest that this documentation has been prepared under the direction and in the presence of No att. providers found. Electronically Signed: Mayer Masker, Scribe. 08/28/16. 3:16 AM.  Time seen 03:05 AM  History   Chief Complaint Chief Complaint  Patient presents with  . Chest Pain  . Shortness of Breath   The history is provided by the patient. No language interpreter was used.    HPI Comments: Aaron Adkins is a 61 y.o. male who presents to the Emergency Department complaining of constant, gradually worsening upper right sided chest pain described as tightness for 2 hours that radiates to bilateral arms. He has associated SOB, and pain in his upper back. He notes he was not doing anything when his symptoms arose, and the pain is worse when he lies flat or breathes deeply. The pain improves when he sits up. He denies nausea, diaphoresis, or swelling in legs. Pt notes he had a pneumothorax 20 years ago and this feels similarly. Pt is a smoker, about 3-4 cigarettes/day, and does not drink. No PMHx of MI. Pt has FMHx of his father having a stroke. Pt has no other pertinent medical histories, including no diabetes, hypertension, high cholesterol and is not taking any daily medications.   PCP none   Past Medical History:  Diagnosis Date  . Pneumothorax   enlarged prostate  There are no active problems to display for this patient.   History reviewed. No pertinent surgical history.     Home Medications    Prior to Admission medications   Not on File    Family History Family History  Problem Relation Age of Onset  . Hypertension Mother   . Stroke Father     Social History Social History  Substance Use Topics  . Smoking status: Current Every Day Smoker    Packs/day: 0.25  . Smokeless tobacco: Never Used  . Alcohol use No   employed   Allergies   Patient has no known allergies.   Review of Systems Review of Systems  Constitutional: Negative for diaphoresis.  Respiratory: Positive for shortness of breath.   Cardiovascular: Positive for chest pain. Negative for leg swelling.  Gastrointestinal: Negative for nausea.  Musculoskeletal: Positive for back pain and myalgias.  All other systems reviewed and are negative.    Physical Exam Updated Vital Signs BP (!) 141/92   Pulse 65   Resp 18   Ht 6\' 3"  (1.905 m)   Wt 220 lb (99.8 kg)   SpO2 99%   BMI 27.50 kg/m   Vital signs normal except mild hypertension   Physical Exam  Constitutional: He is oriented to person, place, and time. He appears well-developed and well-nourished.  Non-toxic appearance. He does not appear ill. No distress.  HENT:  Head: Normocephalic and atraumatic.  Right Ear: External ear normal.  Left Ear: External ear normal.  Nose: Nose normal. No mucosal edema or rhinorrhea.  Mouth/Throat: Oropharynx is clear and moist and mucous membranes are normal. No dental abscesses or uvula swelling.  Eyes: Conjunctivae and EOM are normal. Pupils are equal, round, and reactive to light.  Neck: Normal range of motion and full passive range of motion without pain. Neck supple.  Cardiovascular: Normal rate, regular rhythm and normal heart sounds.  Exam reveals no gallop and no friction rub.   No murmur heard. Pulmonary/Chest: Effort normal and breath sounds  normal. No respiratory distress. He has no wheezes. He has no rhonchi. He has no rales. He exhibits no tenderness and no crepitus.    Bilateral tactile fremitus Areas of chest tightness noted  Abdominal: Soft. Normal appearance and bowel sounds are normal. He exhibits no distension. There is no tenderness. There is no rebound and no guarding.  Musculoskeletal: Normal range of motion. He exhibits no edema or tenderness.  Moves all extremities well.   Neurological: He is alert and  oriented to person, place, and time. He has normal strength. No cranial nerve deficit.  Skin: Skin is warm, dry and intact. No rash noted. No erythema. No pallor.  Psychiatric: He has a normal mood and affect. His speech is normal and behavior is normal. His mood appears not anxious.  Nursing note and vitals reviewed.    ED Treatments / Results  Labs (all labs ordered are listed, but only abnormal results are displayed)  Results for orders placed or performed during the hospital encounter of 66/29/47  Basic metabolic panel  Result Value Ref Range   Sodium 141 135 - 145 mmol/L   Potassium 4.2 3.5 - 5.1 mmol/L   Chloride 109 101 - 111 mmol/L   CO2 24 22 - 32 mmol/L   Glucose, Bld 110 (H) 65 - 99 mg/dL   BUN 14 6 - 20 mg/dL   Creatinine, Ser 0.88 0.61 - 1.24 mg/dL   Calcium 9.2 8.9 - 10.3 mg/dL   GFR calc non Af Amer >60 >60 mL/min   GFR calc Af Amer >60 >60 mL/min   Anion gap 8 5 - 15  CBC  Result Value Ref Range   WBC 11.4 (H) 4.0 - 10.5 K/uL   RBC 4.60 4.22 - 5.81 MIL/uL   Hemoglobin 14.7 13.0 - 17.0 g/dL   HCT 42.7 39.0 - 52.0 %   MCV 92.8 78.0 - 100.0 fL   MCH 32.0 26.0 - 34.0 pg   MCHC 34.4 30.0 - 36.0 g/dL   RDW 14.3 11.5 - 15.5 %   Platelets 255 150 - 400 K/uL  Protime-INR  Result Value Ref Range   Prothrombin Time 12.5 11.4 - 15.2 seconds   INR 0.93   APTT  Result Value Ref Range   aPTT 37 (H) 24 - 36 seconds  Troponin I  Result Value Ref Range   Troponin I <0.03 <0.03 ng/mL  I-stat troponin, ED  Result Value Ref Range   Troponin i, poc 0.00 0.00 - 0.08 ng/mL   Comment 3            Laboratory interpretation all normal except leukocytosis    EKG  EKG Interpretation  Date/Time:  Friday August 28 2016 02:59:07 EDT Ventricular Rate:  68 PR Interval:    QRS Duration: 94 QT Interval:  401 QTC Calculation: 427 R Axis:   56 Text Interpretation:  Sinus rhythm Prolonged PR interval ST elevation suggests acute pericarditis ** ** ACUTE MI / STEMI ** **  Inferolateral leads No old tracing to compare Confirmed by Rolland Porter [phone]) on 08/28/2016 3:03:02 AM       Radiology No results found.  Procedures Procedures (including critical care time)  CRITICAL CARE Performed by: Jaylend Reiland L Renaud Celli Total critical care time: 31 minutes Critical care time was exclusive of separately billable procedures and treating other patients. Critical care was necessary to treat or prevent imminent or life-threatening deterioration. Critical care was time spent personally by me on the following activities: development of treatment plan with  patient and/or surrogate as well as nursing, discussions with consultants, evaluation of patient's response to treatment, examination of patient, obtaining history from patient or surrogate, ordering and performing treatments and interventions, ordering and review of laboratory studies, ordering and review of radiographic studies, pulse oximetry and re-evaluation of patient's condition.   Medications Ordered in ED Medications  0.9 %  sodium chloride infusion ( Intravenous New Bag/Given 08/28/16 0316)  heparin infusion 2 units/mL in 0.9 % sodium chloride (1,000 mLs Other New Bag/Given 08/28/16 0400)  midazolam (VERSED) injection (2 mg Intravenous Given 08/28/16 0400)  fentaNYL (SUBLIMAZE) injection (25 mcg Intravenous Given 08/28/16 0401)  lidocaine (PF) (XYLOCAINE) 1 % injection (18 mLs  Given 08/28/16 0407)  Radial Cocktail/Verapamil only (10 mLs Intra-arterial Given 08/28/16 0403)  aspirin chewable tablet 324 mg (324 mg Oral Given 08/28/16 0316)  heparin injection 4,000 Units (4,000 Units Intravenous Given 08/28/16 0316)  nitroGLYCERIN (NITROGLYN) 2 % ointment 1 inch (1 inch Topical Given 08/28/16 0317)     Initial Impression / Assessment and Plan / ED Course  I have reviewed the triage vital signs and the nursing notes.  Pertinent labs & imaging results that were available during my care of the patient were reviewed by me and  considered in my medical decision making (see chart for details).     DIAGNOSTIC STUDIES: Oxygen Saturation is 99% on RA, normal by my interpretation.    COORDINATION OF CARE: 3:15 AM Discussed treatment plan with pt at bedside and pt agreed to plan. After reviewing patient's EKG code STEMI was called. Patient was started on oral aspirin, heparin and nitroglycerin ointment.  3:13 AM Dr. Julianne Handler, interventional cardiologist discussed patient. He wants him to go to [facility] ED to be seen, he is going to call in the cardiac Cath Lab staff.  4:07 AM Dr.Glick, ED physician at [facility], notified of transfer.  3:19 AM Dr. Harrell Gave, cardiology fellow on call, caled to be updated about patient.   Final Clinical Impressions(s) / ED Diagnoses   Final diagnoses:  ST elevation myocardial infarction (STEMI), unspecified artery ([place])    Plan transfer to [facility] ED  Rolland Porter, MD, FACEP  I personally performed the services described in this documentation, which was scribed in my presence. The recorded information has been reviewed and considered.  Rolland Porter, MD, Barbette Or, MD 08/28/16 [place], [place], MD 08/28/16 [phone]

## 2016-08-28 NOTE — ED Notes (Signed)
Transported to [facility] per [facility]

## 2016-08-28 NOTE — ED Notes (Signed)
Unable to complete portable chest x-ray due to equipment malfunction

## 2016-08-28 NOTE — ED Notes (Signed)
Called [facility] code sstemi 0312am

## 2016-08-31 ENCOUNTER — Telehealth: Payer: Self-pay | Admitting: Cardiology

## 2016-08-31 NOTE — Telephone Encounter (Signed)
Follow up    Pt is returning your call from earlier

## 2016-08-31 NOTE — Telephone Encounter (Signed)
Would continue on the Ibuprofen as previously discussed. If still having chest pain, we can do a steroid dose in place of the Colchicine (PO Prednisone 5mg  daily for 5 days). If not having any pain, would just continue on Ibuprofen for 2 weeks then discontinue it at that time. Thanks!

## 2016-08-31 NOTE — Telephone Encounter (Signed)
New message    Pt c/o medication issue:  1. Name of Medication: colchicine 0.6 mg  2. How are you currently taking this medication (dosage and times per day)? 1 tab by mouth twice daily  3. Are you having a reaction (difficulty breathing--STAT)? No   4. What is your medication issue? Pt is calling stating that his prescription is too high. He is calling for something else. He said he got it cheaper for some where else but it's too high, he can not afford it right now.

## 2016-08-31 NOTE — Telephone Encounter (Signed)
Pt has been made aware to continue with the Ibuprofen as discussed and to call us if he continued to have chest pain, then we will do a Prednisone pack (5mg  daily for 5 days)  Pt verbalized understanding.

## 2016-09-14 ENCOUNTER — Ambulatory Visit: Payer: Medicare HMO | Admitting: Cardiology

## 2016-11-03 DIAGNOSIS — N3281 Overactive bladder: Secondary | ICD-10-CM | POA: Diagnosis not present

## 2016-11-03 DIAGNOSIS — M7022 Olecranon bursitis, left elbow: Secondary | ICD-10-CM | POA: Diagnosis not present

## 2016-11-03 DIAGNOSIS — I10 Essential (primary) hypertension: Secondary | ICD-10-CM | POA: Diagnosis not present

## 2016-11-03 DIAGNOSIS — E559 Vitamin D deficiency, unspecified: Secondary | ICD-10-CM | POA: Diagnosis not present

## 2016-11-03 DIAGNOSIS — Z720 Tobacco use: Secondary | ICD-10-CM | POA: Diagnosis not present

## 2016-11-03 DIAGNOSIS — R972 Elevated prostate specific antigen [PSA]: Secondary | ICD-10-CM | POA: Diagnosis not present

## 2016-11-03 DIAGNOSIS — E785 Hyperlipidemia, unspecified: Secondary | ICD-10-CM | POA: Diagnosis not present

## 2016-11-30 DIAGNOSIS — Z1211 Encounter for screening for malignant neoplasm of colon: Secondary | ICD-10-CM | POA: Diagnosis not present

## 2016-12-31 DIAGNOSIS — Z0000 Encounter for general adult medical examination without abnormal findings: Secondary | ICD-10-CM | POA: Diagnosis not present

## 2016-12-31 DIAGNOSIS — Z6828 Body mass index (BMI) 28.0-28.9, adult: Secondary | ICD-10-CM | POA: Diagnosis not present

## 2016-12-31 DIAGNOSIS — K08109 Complete loss of teeth, unspecified cause, unspecified class: Secondary | ICD-10-CM | POA: Diagnosis not present

## 2016-12-31 DIAGNOSIS — Z972 Presence of dental prosthetic device (complete) (partial): Secondary | ICD-10-CM | POA: Diagnosis not present

## 2016-12-31 DIAGNOSIS — R69 Illness, unspecified: Secondary | ICD-10-CM | POA: Diagnosis not present

## 2017-03-09 ENCOUNTER — Ambulatory Visit: Payer: Self-pay | Admitting: Nurse Practitioner

## 2017-03-10 ENCOUNTER — Encounter: Payer: Self-pay | Admitting: Internal Medicine

## 2017-03-10 ENCOUNTER — Encounter: Payer: Self-pay | Admitting: Nurse Practitioner

## 2017-03-10 ENCOUNTER — Ambulatory Visit (INDEPENDENT_AMBULATORY_CARE_PROVIDER_SITE_OTHER): Payer: Medicare HMO | Admitting: Nurse Practitioner

## 2017-03-10 VITALS — BP 142/92 | HR 71 | Temp 98.4°F | Resp 17 | Ht 75.0 in | Wt 216.4 lb

## 2017-03-10 DIAGNOSIS — Z23 Encounter for immunization: Secondary | ICD-10-CM

## 2017-03-10 DIAGNOSIS — R030 Elevated blood-pressure reading, without diagnosis of hypertension: Secondary | ICD-10-CM

## 2017-03-10 DIAGNOSIS — M25422 Effusion, left elbow: Secondary | ICD-10-CM

## 2017-03-10 DIAGNOSIS — Z13220 Encounter for screening for lipoid disorders: Secondary | ICD-10-CM

## 2017-03-10 DIAGNOSIS — R972 Elevated prostate specific antigen [PSA]: Secondary | ICD-10-CM

## 2017-03-10 DIAGNOSIS — M1990 Unspecified osteoarthritis, unspecified site: Secondary | ICD-10-CM

## 2017-03-10 DIAGNOSIS — I10 Essential (primary) hypertension: Secondary | ICD-10-CM | POA: Diagnosis not present

## 2017-03-10 DIAGNOSIS — E785 Hyperlipidemia, unspecified: Secondary | ICD-10-CM | POA: Diagnosis not present

## 2017-03-10 DIAGNOSIS — Z1211 Encounter for screening for malignant neoplasm of colon: Secondary | ICD-10-CM | POA: Diagnosis not present

## 2017-03-10 LAB — CBC WITH DIFFERENTIAL/PLATELET
BASOS PCT: 0.6 %
Basophils Absolute: 43 cells/uL (ref 0–200)
EOS ABS: 213 {cells}/uL (ref 15–500)
Eosinophils Relative: 3 %
HCT: 39.6 % (ref 38.5–50.0)
Hemoglobin: 13.7 g/dL (ref 13.2–17.1)
Lymphs Abs: 2308 cells/uL (ref 850–3900)
MCH: 32 pg (ref 27.0–33.0)
MCHC: 34.6 g/dL (ref 32.0–36.0)
MCV: 92.5 fL (ref 80.0–100.0)
MPV: 10.9 fL (ref 7.5–12.5)
Monocytes Relative: 9.6 %
Neutro Abs: 3855 cells/uL (ref 1500–7800)
Neutrophils Relative %: 54.3 %
PLATELETS: 308 10*3/uL (ref 140–400)
RBC: 4.28 10*6/uL (ref 4.20–5.80)
RDW: 12.7 % (ref 11.0–15.0)
TOTAL LYMPHOCYTE: 32.5 %
WBC: 7.1 10*3/uL (ref 3.8–10.8)
WBCMIX: 682 {cells}/uL (ref 200–950)

## 2017-03-10 LAB — COMPLETE METABOLIC PANEL WITH GFR
AG Ratio: 1.1 (calc) (ref 1.0–2.5)
ALBUMIN MSPROF: 3.7 g/dL (ref 3.6–5.1)
ALT: 42 U/L (ref 9–46)
AST: 33 U/L (ref 10–35)
Alkaline phosphatase (APISO): 73 U/L (ref 40–115)
BILIRUBIN TOTAL: 0.6 mg/dL (ref 0.2–1.2)
BUN: 15 mg/dL (ref 7–25)
CHLORIDE: 108 mmol/L (ref 98–110)
CO2: 24 mmol/L (ref 20–32)
Calcium: 9.2 mg/dL (ref 8.6–10.3)
Creat: 0.7 mg/dL (ref 0.70–1.25)
GFR, EST AFRICAN AMERICAN: 118 mL/min/{1.73_m2} (ref >=60)
GFR, Est Non African American: 102 mL/min/{1.73_m2} (ref >=60)
Globulin: 3.3 g/dL (calc) (ref 1.9–3.7)
Glucose, Bld: 83 mg/dL (ref 65–139)
POTASSIUM: 4.2 mmol/L (ref 3.5–5.3)
Sodium: 138 mmol/L (ref 135–146)
TOTAL PROTEIN: 7 g/dL (ref 6.1–8.1)

## 2017-03-10 NOTE — Progress Notes (Signed)
Careteam: Patient Care Team: [name], NP as PCP - General (Geriatric Medicine)  Advanced Directive information Does Patient Have a Medical Advance Directive?: No, Would patient like information on creating a medical advance directive?: Yes (MAU/Ambulatory/Procedural Areas - Information given)  No Known Allergies  Chief Complaint  Patient presents with  . Medical Management of Chronic Issues    Pt is being seen to establish care for management of chronic conditions.   . Medication Management    Pt did not bring medication bottles but he states that he takes his wife's inflamation medication  . Depression and fall screeing    both screenings are negative     HPI: Patient is a 62 y.o. male seen in the office today to establish care. Looking for a new practice but previously being seen in [place].  Hx of gout vs arthritis.  No formal gout diagnosis  Takes a pill that his wife has to help with inflammation.  Feels like he needs something daily for arthritis daily- takes 1 a day and they work well.  Last physical was about a year ago.  Not fasting.   Left elbow swollen, off and on occasionally. Happens when he is working. No pain or discomfort. No warmth or heat. Normal ROM.   Review of Systems:  Review of Systems  Constitutional: Negative for chills, fever and weight loss.  HENT: Negative for tinnitus.   Respiratory: Negative for cough, sputum production and shortness of breath.   Cardiovascular: Negative for chest pain, palpitations and leg swelling.  Gastrointestinal: Negative for abdominal pain, constipation, diarrhea and heartburn.  Genitourinary: Negative for dysuria, frequency and urgency.       Gets up once at night to urinate, hx of elevated PSA  Musculoskeletal: Positive for joint pain. Negative for back pain, falls and myalgias.       Arthritis in knees (rare) more common in shoulders, arms and ankles. Worse in the morning but better as the day  progresses  Skin: Negative.   Neurological: Negative for dizziness and headaches.  Psychiatric/Behavioral: Negative for depression and memory loss. The patient does not have insomnia.     Past Medical History:  Diagnosis Date  . Chest pain 08/28/2016  . Electrocardiogram suggestive of ST elevation myocardial infarction (STEMI) ([place]) 08/28/2016  . Pericarditis    a. diagnosed in 07/2016 - cath showed no angiographic evidence of CAD.   Marland Kitchen Pneumothorax    Past Surgical History:  Procedure Laterality Date  . LEFT HEART CATH AND CORONARY ANGIOGRAPHY N/A 08/28/2016   Procedure: Left Heart Cath and Coronary Angiography;  Surgeon: Burnell Blanks, MD;  Location: [place] [facility];  Service: Cardiovascular;  Laterality: N/A;   Social History:   reports that he has been smoking.  He has been smoking about 0.25 packs per day. he has never used smokeless tobacco. He reports that he drinks alcohol. He reports that he uses drugs. Drug: Marijuana. Frequency: 3.00 times per week.  Family History  Problem Relation Age of Onset  . Hypertension Mother   . Stroke Father     Medications:   Medication List        Accurate as of 03/10/17  9:14 AM. Always use your most recent med list.          AMBULATORY NON FORMULARY MEDICATION   CENTRUM PO        Physical Exam:  Vitals:   03/10/17 0906  BP: (!) 142/92  Pulse: 71  Resp:  17  Temp: 98.4 F (36.9 C)  TempSrc: Oral  SpO2: 96%  Weight: 216 lb 6.4 oz (98.2 kg)  Height: _0  (1.905 m)   Body mass index is 27.05 kg/m.  Physical Exam  Constitutional: He is oriented to person, place, and time. He appears well-developed and well-nourished. No distress.  HENT:  Head: Normocephalic and atraumatic.  Mouth/Throat: Oropharynx is clear and moist. No oropharyngeal exudate.  Eyes: Conjunctivae and EOM are normal. Pupils are equal, round, and reactive to light.  Neck: Normal range of motion. Neck supple.  Cardiovascular: Normal rate,  regular rhythm and normal heart sounds.  Pulmonary/Chest: Effort normal and breath sounds normal.  Abdominal: Soft. Bowel sounds are normal.  Musculoskeletal: He exhibits no edema or tenderness.       Left elbow: He exhibits swelling. He exhibits normal range of motion. No tenderness found.  Neurological: He is alert and oriented to person, place, and time.  Skin: Skin is warm and dry. He is not diaphoretic.  Psychiatric: He has a normal mood and affect.    Labs reviewed: Basic Metabolic Panel: Recent Labs    08/28/16 0301 08/28/16 0728  NA 141 138  K 4.2 3.8  CL 109 108  CO2 24 22  GLUCOSE 110* 112*  BUN 14 10  CREATININE 0.88 0.74  CALCIUM 9.2 9.1   Liver Function Tests: No results for input(s): AST, ALT, ALKPHOS, BILITOT, PROT, ALBUMIN in the last 8760 hours. No results for input(s): LIPASE, AMYLASE in the last 8760 hours. No results for input(s): AMMONIA in the last 8760 hours. CBC: Recent Labs    08/28/16 0301 08/28/16 0728  WBC 11.4* 10.4  HGB 14.7 13.9  HCT 42.7 40.6  MCV 92.8 94.9  PLT 255 229   Lipid Panel: Recent Labs    08/28/16 0304  CHOL 172  HDL 53  LDLCALC 106*  TRIG 63  CHOLHDL 3.2   TSH: No results for input(s): TSH in the last 8760 hours. A1C: No results found for: HGBA1C   Assessment/Plan 1. Osteoarthritis, unspecified osteoarthritis type, unspecified site -taking his wife's antiinflammatory however unsure of name, will call with this because he reports it has been very effective.  - CBC with Differential/Platelets - CMP with eGFR - Uric acid  2. Elevated blood pressure reading -elevated today, said he has had borderline high blood pressure. Dash diet given and to monitor at home and bring blood pressure readings to next OV  3. Elevated PSA -reports hx of elevated PSA, will get medical records from previous PCP - PSA  4. Screening for colon cancer - Ambulatory referral to Gastroenterology  5. Screening for lipid disorders -  Lipid Panel; Future  6. Need for immunization against influenza - Flu Vaccine QUAD 6+ mos PF IM (Fluarix Quad PF)  7. Elbow swelling, left -encouraged to avoid overuse, to ice 2-3 times daily, avoid pressure. To call if pain, heat or swelling becomes worse.  - Uric acid  Next appt: 2 months for EV with fasting blood work prior to appt.  Carlos American. Harle Battiest  [facility] [phone] 8 am - 5 pm) [phone] (after hours)

## 2017-03-10 NOTE — Patient Instructions (Signed)
To ice elbow ~3 times daily for 20-30 mins to reduce swelling. Avoid pressure or overuse  To check blood pressure a few times at home and record Make sure you are sitting and relax ~ 5 mins before taking -dash diet  DASH Eating Plan DASH stands for "Dietary Approaches to Stop Hypertension." The DASH eating plan is a healthy eating plan that has been shown to reduce high blood pressure (hypertension). It may also reduce your risk for type 2 diabetes, heart disease, and stroke. The DASH eating plan may also help with weight loss. What are tips for following this plan? General guidelines  Avoid eating more than 2,300 mg (milligrams) of salt (sodium) a day. If you have hypertension, you may need to reduce your sodium intake to 1,500 mg a day.  Limit alcohol intake to no more than 1 drink a day for nonpregnant women and 2 drinks a day for men. One drink equals 12 oz of beer, 5 oz of wine, or 1 oz of hard liquor.  Work with your health care provider to maintain a healthy body weight or to lose weight. Ask what an ideal weight is for you.  Get at least 30 minutes of exercise that causes your heart to beat faster (aerobic exercise) most days of the week. Activities may include walking, swimming, or biking.  Work with your health care provider or diet and nutrition specialist (dietitian) to adjust your eating plan to your individual calorie needs. Reading food labels  Check food labels for the amount of sodium per serving. Choose foods with less than 5 percent of the Daily Value of sodium. Generally, foods with less than 300 mg of sodium per serving fit into this eating plan.  To find whole grains, look for the word "whole" as the first word in the ingredient list. Shopping  Buy products labeled as "low-sodium" or "no salt added."  Buy fresh foods. Avoid canned foods and premade or frozen meals. Cooking  Avoid adding salt when cooking. Use salt-free seasonings or herbs instead of table salt  or sea salt. Check with your health care provider or pharmacist before using salt substitutes.  Do not fry foods. Cook foods using healthy methods such as baking, boiling, grilling, and broiling instead.  Cook with heart-healthy oils, such as olive, canola, soybean, or sunflower oil. Meal planning   Eat a balanced diet that includes: ? 5 or more servings of fruits and vegetables each day. At each meal, try to fill half of your plate with fruits and vegetables. ? Up to 6-8 servings of whole grains each day. ? Less than 6 oz of lean meat, poultry, or fish each day. A 3-oz serving of meat is about the same size as a deck of cards. One egg equals 1 oz. ? 2 servings of low-fat dairy each day. ? A serving of nuts, seeds, or beans 5 times each week. ? Heart-healthy fats. Healthy fats called Omega-3 fatty acids are found in foods such as flaxseeds and coldwater fish, like sardines, salmon, and mackerel.  Limit how much you eat of the following: ? Canned or prepackaged foods. ? Food that is high in trans fat, such as fried foods. ? Food that is high in saturated fat, such as fatty meat. ? Sweets, desserts, sugary drinks, and other foods with added sugar. ? Full-fat dairy products.  Do not salt foods before eating.  Try to eat at least 2 vegetarian meals each week.  Eat more home-cooked food and less restaurant,  buffet, and fast food.  When eating at a restaurant, ask that your food be prepared with less salt or no salt, if possible. What foods are recommended? The items listed may not be a complete list. Talk with your dietitian about what dietary choices are best for you. Grains Whole-grain or whole-wheat bread. Whole-grain or whole-wheat pasta. Brown rice. [place]row. Bulgur. Whole-grain and low-sodium cereals. Pita bread. Low-fat, low-sodium crackers. Whole-wheat flour tortillas. Vegetables Fresh or frozen vegetables (raw, steamed, roasted, or grilled). Low-sodium or reduced-sodium  tomato and vegetable juice. Low-sodium or reduced-sodium tomato sauce and tomato paste. Low-sodium or reduced-sodium canned vegetables. Fruits All fresh, dried, or frozen fruit. Canned fruit in natural juice (without added sugar). Meat and other protein foods Skinless chicken or [place]. Ground chicken or [place]. Pork with fat trimmed off. Fish and seafood. Egg whites. Dried beans, peas, or lentils. Unsalted nuts, nut butters, and seeds. Unsalted canned beans. Lean cuts of beef with fat trimmed off. Low-sodium, lean deli meat. Dairy Low-fat (1%) or fat-free (skim) milk. Fat-free, low-fat, or reduced-fat cheeses. Nonfat, low-sodium ricotta or cottage cheese. Low-fat or nonfat yogurt. Low-fat, low-sodium cheese. Fats and oils Soft margarine without trans fats. Vegetable oil. Low-fat, reduced-fat, or light mayonnaise and salad dressings (reduced-sodium). Canola, safflower, olive, soybean, and sunflower oils. Avocado. Seasoning and other foods Herbs. Spices. Seasoning mixes without salt. Unsalted popcorn and pretzels. Fat-free sweets. What foods are not recommended? The items listed may not be a complete list. Talk with your dietitian about what dietary choices are best for you. Grains Baked goods made with fat, such as croissants, muffins, or some breads. Dry pasta or rice meal packs. Vegetables Creamed or fried vegetables. Vegetables in a cheese sauce. Regular canned vegetables (not low-sodium or reduced-sodium). Regular canned tomato sauce and paste (not low-sodium or reduced-sodium). Regular tomato and vegetable juice (not low-sodium or reduced-sodium). Angie Fava. Olives. Fruits Canned fruit in a light or heavy syrup. Fried fruit. Fruit in cream or butter sauce. Meat and other protein foods Fatty cuts of meat. Ribs. Fried meat. Berniece Salines. Sausage. Bologna and other processed lunch meats. Salami. Fatback. Hotdogs. Bratwurst. Salted nuts and seeds. Canned beans with added salt. Canned or smoked fish.  Whole eggs or egg yolks. Chicken or [place] with skin. Dairy Whole or 2% milk, cream, and half-and-half. Whole or full-fat cream cheese. Whole-fat or sweetened yogurt. Full-fat cheese. Nondairy creamers. Whipped toppings. Processed cheese and cheese spreads. Fats and oils Butter. Stick margarine. Lard. Shortening. Ghee. Bacon fat. Tropical oils, such as coconut, palm kernel, or palm oil. Seasoning and other foods Salted popcorn and pretzels. Onion salt, garlic salt, seasoned salt, table salt, and sea salt. Worcestershire sauce. Tartar sauce. Barbecue sauce. Teriyaki sauce. Soy sauce, including reduced-sodium. Steak sauce. Canned and packaged gravies. Fish sauce. Oyster sauce. Cocktail sauce. Horseradish that you find on the shelf. Ketchup. Mustard. Meat flavorings and tenderizers. Bouillon cubes. Hot sauce and Tabasco sauce. Premade or packaged marinades. Premade or packaged taco seasonings. Relishes. Regular salad dressings. Where to find more information:  National Heart, Lung, and [place]: https://wilson-eaton.com/  American Heart Association: www.heart.org Summary  The DASH eating plan is a healthy eating plan that has been shown to reduce high blood pressure (hypertension). It may also reduce your risk for type 2 diabetes, heart disease, and stroke.  With the DASH eating plan, you should limit salt (sodium) intake to 2,300 mg a day. If you have hypertension, you may need to reduce your sodium intake to 1,500 mg a day.  When  on the DASH eating plan, aim to eat more fresh fruits and vegetables, whole grains, lean proteins, low-fat dairy, and heart-healthy fats.  Work with your health care provider or diet and nutrition specialist (dietitian) to adjust your eating plan to your individual calorie needs. This information is not intended to replace advice given to you by your health care provider. Make sure you discuss any questions you have with your health care provider. Document Released:  02/05/2011 Document Revised: 02/10/2016 Document Reviewed: 02/10/2016 Elsevier Interactive Patient Education  [employer].

## 2017-03-11 ENCOUNTER — Other Ambulatory Visit: Payer: Self-pay | Admitting: Nurse Practitioner

## 2017-03-11 DIAGNOSIS — R972 Elevated prostate specific antigen [PSA]: Secondary | ICD-10-CM

## 2017-03-11 LAB — URIC ACID: Uric Acid, Serum: 3.9 mg/dL — ABNORMAL LOW (ref 4.0–8.0)

## 2017-03-11 LAB — PSA: PSA: 113.6 ng/mL — ABNORMAL HIGH (ref <=4.0)

## 2017-03-11 NOTE — Progress Notes (Signed)
ur

## 2017-03-15 ENCOUNTER — Telehealth: Payer: Self-pay | Admitting: *Deleted

## 2017-03-15 MED ORDER — DICLOFENAC SODIUM 75 MG PO TBEC: 75.0000 mg | DELAYED_RELEASE_TABLET | Freq: Every day | ORAL | 2 refills | Status: DC

## 2017-03-15 NOTE — Telephone Encounter (Signed)
Patient called and stated that Janett Billow wanted him to call back with the medication he has been taking for inflammation. Stated that the medication name is Diclofenac 75mg  once daily. Can a rx be called into pharmacy. Please Advise.

## 2017-03-15 NOTE — Telephone Encounter (Signed)
Fax Rx into pharmacy on file. Make sure to keep follow up appts. Will need to follow up labs to make sure it does not effect kidney function

## 2017-03-15 NOTE — Telephone Encounter (Signed)
Patient notified and agreed.  

## 2017-03-30 DIAGNOSIS — R3121 Asymptomatic microscopic hematuria: Secondary | ICD-10-CM | POA: Diagnosis not present

## 2017-03-30 DIAGNOSIS — R3915 Urgency of urination: Secondary | ICD-10-CM | POA: Diagnosis not present

## 2017-03-30 DIAGNOSIS — R972 Elevated prostate specific antigen [PSA]: Secondary | ICD-10-CM | POA: Diagnosis not present

## 2017-04-27 ENCOUNTER — Ambulatory Visit: Payer: Medicare HMO

## 2017-04-27 VITALS — Ht 74.5 in | Wt 211.2 lb

## 2017-04-27 DIAGNOSIS — Z1211 Encounter for screening for malignant neoplasm of colon: Secondary | ICD-10-CM

## 2017-04-27 NOTE — Progress Notes (Signed)
Per pt, no allergies to soy or egg products.Pt not taking any weight loss meds or using  O2 at home.  Pt refused emmi video. 

## 2017-04-28 DIAGNOSIS — R972 Elevated prostate specific antigen [PSA]: Secondary | ICD-10-CM | POA: Diagnosis not present

## 2017-04-28 DIAGNOSIS — C61 Malignant neoplasm of prostate: Secondary | ICD-10-CM | POA: Diagnosis not present

## 2017-04-28 DIAGNOSIS — N401 Enlarged prostate with lower urinary tract symptoms: Secondary | ICD-10-CM | POA: Diagnosis not present

## 2017-04-28 DIAGNOSIS — R3915 Urgency of urination: Secondary | ICD-10-CM | POA: Diagnosis not present

## 2017-04-28 DIAGNOSIS — R3121 Asymptomatic microscopic hematuria: Secondary | ICD-10-CM | POA: Diagnosis not present

## 2017-04-29 ENCOUNTER — Encounter: Payer: Self-pay | Admitting: Internal Medicine

## 2017-04-30 ENCOUNTER — Other Ambulatory Visit: Payer: Self-pay | Admitting: Urology

## 2017-04-30 DIAGNOSIS — C61 Malignant neoplasm of prostate: Secondary | ICD-10-CM

## 2017-05-04 DIAGNOSIS — C61 Malignant neoplasm of prostate: Secondary | ICD-10-CM | POA: Diagnosis not present

## 2017-05-05 ENCOUNTER — Other Ambulatory Visit: Payer: Medicare HMO

## 2017-05-05 ENCOUNTER — Other Ambulatory Visit: Payer: Self-pay | Admitting: Nurse Practitioner

## 2017-05-05 DIAGNOSIS — E785 Hyperlipidemia, unspecified: Secondary | ICD-10-CM | POA: Diagnosis not present

## 2017-05-05 DIAGNOSIS — Z13220 Encounter for screening for lipoid disorders: Secondary | ICD-10-CM

## 2017-05-05 DIAGNOSIS — Z0000 Encounter for general adult medical examination without abnormal findings: Secondary | ICD-10-CM | POA: Diagnosis not present

## 2017-05-05 LAB — LIPID PANEL
CHOLESTEROL: 162 mg/dL (ref <200)
HDL: 45 mg/dL (ref >40)
LDL Cholesterol (Calc): 102 mg/dL (calc) — ABNORMAL HIGH
Non-HDL Cholesterol (Calc): 117 mg/dL (calc) (ref <130)
Total CHOL/HDL Ratio: 3.6 (calc) (ref <5.0)
Triglycerides: 60 mg/dL (ref <150)

## 2017-05-07 ENCOUNTER — Telehealth: Payer: Self-pay | Admitting: Internal Medicine

## 2017-05-07 ENCOUNTER — Encounter: Payer: Self-pay | Admitting: Nurse Practitioner

## 2017-05-07 ENCOUNTER — Ambulatory Visit (INDEPENDENT_AMBULATORY_CARE_PROVIDER_SITE_OTHER): Payer: Medicare HMO | Admitting: Nurse Practitioner

## 2017-05-07 VITALS — BP 132/90 | HR 63 | Temp 98.6°F | Ht 75.0 in | Wt 214.4 lb

## 2017-05-07 DIAGNOSIS — Z0000 Encounter for general adult medical examination without abnormal findings: Secondary | ICD-10-CM

## 2017-05-07 DIAGNOSIS — R030 Elevated blood-pressure reading, without diagnosis of hypertension: Secondary | ICD-10-CM

## 2017-05-07 MED ORDER — TETANUS-DIPHTH-ACELL PERTUSSIS 5-2.5-18.5 LF-MCG/0.5 IM SUSP: 0.5000 mL | Freq: Once | INTRAMUSCULAR | 0 refills | Status: AC

## 2017-05-07 NOTE — Patient Instructions (Addendum)
Blood pressure should be below 140/90  5 months with Dr Eulas Post 10 months with Glory Buff if needed   DASH Eating Plan DASH stands for "Dietary Approaches to Stop Hypertension." The DASH eating plan is a healthy eating plan that has been shown to reduce high blood pressure (hypertension). It may also reduce your risk for type 2 diabetes, heart disease, and stroke. The DASH eating plan may also help with weight loss. What are tips for following this plan? General guidelines  Avoid eating more than 2,300 mg (milligrams) of salt (sodium) a day. If you have hypertension, you may need to reduce your sodium intake to 1,500 mg a day.  Limit alcohol intake to no more than 1 drink a day for nonpregnant women and 2 drinks a day for men. One drink equals 12 oz of beer, 5 oz of wine, or 1 oz of hard liquor.  Work with your health care provider to maintain a healthy body weight or to lose weight. Ask what an ideal weight is for you.  Get at least 30 minutes of exercise that causes your heart to beat faster (aerobic exercise) most days of the week. Activities may include walking, swimming, or biking.  Work with your health care provider or diet and nutrition specialist (dietitian) to adjust your eating plan to your individual calorie needs. Reading food labels  Check food labels for the amount of sodium per serving. Choose foods with less than 5 percent of the Daily Value of sodium. Generally, foods with less than 300 mg of sodium per serving fit into this eating plan.  To find whole grains, look for the word "whole" as the first word in the ingredient list. Shopping  Buy products labeled as "low-sodium" or "no salt added."  Buy fresh foods. Avoid canned foods and premade or frozen meals. Cooking  Avoid adding salt when cooking. Use salt-free seasonings or herbs instead of table salt or sea salt. Check with your health care provider or pharmacist before using salt substitutes.  Do not fry  foods. Cook foods using healthy methods such as baking, boiling, grilling, and broiling instead.  Cook with heart-healthy oils, such as olive, canola, soybean, or sunflower oil. Meal planning   Eat a balanced diet that includes: ? 5 or more servings of fruits and vegetables each day. At each meal, try to fill half of your plate with fruits and vegetables. ? Up to 6-8 servings of whole grains each day. ? Less than 6 oz of lean meat, poultry, or fish each day. A 3-oz serving of meat is about the same size as a deck of cards. One egg equals 1 oz. ? 2 servings of low-fat dairy each day. ? A serving of nuts, seeds, or beans 5 times each week. ? Heart-healthy fats. Healthy fats called Omega-3 fatty acids are found in foods such as flaxseeds and coldwater fish, like sardines, salmon, and mackerel.  Limit how much you eat of the following: ? Canned or prepackaged foods. ? Food that is high in trans fat, such as fried foods. ? Food that is high in saturated fat, such as fatty meat. ? Sweets, desserts, sugary drinks, and other foods with added sugar. ? Full-fat dairy products.  Do not salt foods before eating.  Try to eat at least 2 vegetarian meals each week.  Eat more home-cooked food and less restaurant, buffet, and fast food.  When eating at a restaurant, ask that your food be prepared with less salt or no  salt, if possible. What foods are recommended? The items listed may not be a complete list. Talk with your dietitian about what dietary choices are best for you. Grains Whole-grain or whole-wheat bread. Whole-grain or whole-wheat pasta. Brown rice. [place]. Bulgur. Whole-grain and low-sodium cereals. Pita bread. Low-fat, low-sodium crackers. Whole-wheat flour tortillas. Vegetables Fresh or frozen vegetables (raw, steamed, roasted, or grilled). Low-sodium or reduced-sodium tomato and vegetable juice. Low-sodium or reduced-sodium tomato sauce and tomato paste. Low-sodium or  reduced-sodium canned vegetables. Fruits All fresh, dried, or frozen fruit. Canned fruit in natural juice (without added sugar). Meat and other protein foods Skinless chicken or [place]. Ground chicken or [place]. Pork with fat trimmed off. Fish and seafood. Egg whites. Dried beans, peas, or lentils. Unsalted nuts, nut butters, and seeds. Unsalted canned beans. Lean cuts of beef with fat trimmed off. Low-sodium, lean deli meat. Dairy Low-fat (1%) or fat-free (skim) milk. Fat-free, low-fat, or reduced-fat cheeses. Nonfat, low-sodium ricotta or cottage cheese. Low-fat or nonfat yogurt. Low-fat, low-sodium cheese. Fats and oils Soft margarine without trans fats. Vegetable oil. Low-fat, reduced-fat, or light mayonnaise and salad dressings (reduced-sodium). Canola, safflower, olive, soybean, and sunflower oils. Avocado. Seasoning and other foods Herbs. Spices. Seasoning mixes without salt. Unsalted popcorn and pretzels. Fat-free sweets. What foods are not recommended? The items listed may not be a complete list. Talk with your dietitian about what dietary choices are best for you. Grains Baked goods made with fat, such as croissants, muffins, or some breads. Dry pasta or rice meal packs. Vegetables Creamed or fried vegetables. Vegetables in a cheese sauce. Regular canned vegetables (not low-sodium or reduced-sodium). Regular canned tomato sauce and paste (not low-sodium or reduced-sodium). Regular tomato and vegetable juice (not low-sodium or reduced-sodium). Angie Fava. Olives. Fruits Canned fruit in a light or heavy syrup. Fried fruit. Fruit in cream or butter sauce. Meat and other protein foods Fatty cuts of meat. Ribs. Fried meat. Berniece Salines. Sausage. Bologna and other processed lunch meats. Salami. Fatback. Hotdogs. Bratwurst. Salted nuts and seeds. Canned beans with added salt. Canned or smoked fish. Whole eggs or egg yolks. Chicken or [place] with skin. Dairy Whole or 2% milk, cream, and half-and-half.  Whole or full-fat cream cheese. Whole-fat or sweetened yogurt. Full-fat cheese. Nondairy creamers. Whipped toppings. Processed cheese and cheese spreads. Fats and oils Butter. Stick margarine. Lard. Shortening. Ghee. Bacon fat. Tropical oils, such as coconut, palm kernel, or palm oil. Seasoning and other foods Salted popcorn and pretzels. Onion salt, garlic salt, seasoned salt, table salt, and sea salt. Worcestershire sauce. Tartar sauce. Barbecue sauce. Teriyaki sauce. Soy sauce, including reduced-sodium. Steak sauce. Canned and packaged gravies. Fish sauce. Oyster sauce. Cocktail sauce. Horseradish that you find on the shelf. Ketchup. Mustard. Meat flavorings and tenderizers. Bouillon cubes. Hot sauce and Tabasco sauce. Premade or packaged marinades. Premade or packaged taco seasonings. Relishes. Regular salad dressings. Where to find more information:  National Heart, Lung, and [place]: https://wilson-eaton.com/  American Heart Association: www.heart.org Summary  The DASH eating plan is a healthy eating plan that has been shown to reduce high blood pressure (hypertension). It may also reduce your risk for type 2 diabetes, heart disease, and stroke.  With the DASH eating plan, you should limit salt (sodium) intake to 2,300 mg a day. If you have hypertension, you may need to reduce your sodium intake to 1,500 mg a day.  When on the DASH eating plan, aim to eat more fresh fruits and vegetables, whole grains, lean proteins, low-fat dairy, and heart-healthy  fats.  Work with your health care provider or diet and nutrition specialist (dietitian) to adjust your eating plan to your individual calorie needs. This information is not intended to replace advice given to you by your health care provider. Make sure you discuss any questions you have with your health care provider. Document Released: 02/05/2011 Document Revised: 02/10/2016 Document Reviewed: 02/10/2016 Elsevier Interactive Patient Education   2018 [place] Maintenance, Male A healthy lifestyle and preventive care is important for your health and wellness. Ask your health care provider about what schedule of regular examinations is right for you. What should I know about weight and diet? Eat a Healthy Diet  Eat plenty of vegetables, fruits, whole grains, low-fat dairy products, and lean protein.  Do not eat a lot of foods high in solid fats, added sugars, or salt.  Maintain a Healthy Weight Regular exercise can help you achieve or maintain a healthy weight. You should:  Do at least 150 minutes of exercise each week. The exercise should increase your heart rate and make you sweat (moderate-intensity exercise).  Do strength-training exercises at least twice a week.  Watch Your Levels of Cholesterol and Blood Lipids  Have your blood tested for lipids and cholesterol every 5 years starting at 62 years of age. If you are at high risk for heart disease, you should start having your blood tested when you are 62 years old. You may need to have your cholesterol levels checked more often if: ? Your lipid or cholesterol levels are high. ? You are older than 62 years of age. ? You are at high risk for heart disease.  What should I know about cancer screening? Many types of cancers can be detected early and may often be prevented. Lung Cancer  You should be screened every year for lung cancer if: ? You are a current smoker who has smoked for at least 30 years. ? You are a former smoker who has quit within the past 15 years.  Talk to your health care provider about your screening options, when you should start screening, and how often you should be screened.  Colorectal Cancer  Routine colorectal cancer screening usually begins at 62 years of age and should be repeated every 5-10 years until you are 62 years old. You may need to be screened more often if early forms of precancerous polyps or small growths are found.  Your health care provider may recommend screening at an earlier age if you have risk factors for colon cancer.  Your health care provider may recommend using home test kits to check for hidden blood in the stool.  A small camera at the end of a tube can be used to examine your colon (sigmoidoscopy or colonoscopy). This checks for the earliest forms of colorectal cancer.  Prostate and Testicular Cancer  Depending on your age and overall health, your health care provider may do certain tests to screen for prostate and testicular cancer.  Talk to your health care provider about any symptoms or concerns you have about testicular or prostate cancer.  Skin Cancer  Check your skin from head to toe regularly.  Tell your health care provider about any new moles or changes in moles, especially if: ? There is a change in a mole's size, shape, or color. ? You have a mole that is larger than a pencil eraser.  Always use sunscreen. Apply sunscreen liberally and repeat throughout the day.  Protect yourself by wearing long  sleeves, pants, a wide-brimmed hat, and sunglasses when outside.  What should I know about heart disease, diabetes, and high blood pressure?  If you are 94-86 years of age, have your blood pressure checked every 3-5 years. If you are 11 years of age or older, have your blood pressure checked every year. You should have your blood pressure measured twice-once when you are at a hospital or clinic, and once when you are not at a hospital or clinic. Record the average of the two measurements. To check your blood pressure when you are not at a hospital or clinic, you can use: ? An automated blood pressure machine at a pharmacy. ? A home blood pressure monitor.  Talk to your health care provider about your target blood pressure.  If you are between 38-88 years old, ask your health care provider if you should take aspirin to prevent heart disease.  Have regular diabetes screenings by  checking your fasting blood sugar level. ? If you are at a normal weight and have a low risk for diabetes, have this test once every three years after the age of 47. ? If you are overweight and have a high risk for diabetes, consider being tested at a younger age or more often.  A one-time screening for abdominal aortic aneurysm (AAA) by ultrasound is recommended for men aged 13-75 years who are current or former smokers. What should I know about preventing infection? Hepatitis B If you have a higher risk for hepatitis B, you should be screened for this virus. Talk with your health care provider to find out if you are at risk for hepatitis B infection. Hepatitis C Blood testing is recommended for:  Everyone born from [birth year] through 1965.  Anyone with known risk factors for hepatitis C.  Sexually Transmitted Diseases (STDs)  You should be screened each year for STDs including gonorrhea and chlamydia if: ? You are sexually active and are younger than 62 years of age. ? You are older than 62 years of age and your health care provider tells you that you are at risk for this type of infection. ? Your sexual activity has changed since you were last screened and you are at an increased risk for chlamydia or gonorrhea. Ask your health care provider if you are at risk.  Talk with your health care provider about whether you are at high risk of being infected with HIV. Your health care provider may recommend a prescription medicine to help prevent HIV infection.  What else can I do?  Schedule regular health, dental, and eye exams.  Stay current with your vaccines (immunizations).  Do not use any tobacco products, such as cigarettes, chewing tobacco, and e-cigarettes. If you need help quitting, ask your health care provider.  Limit alcohol intake to no more than 2 drinks per day. One drink equals 12 ounces of beer, 5 ounces of wine, or 1 ounces of hard liquor.  Do not use street drugs.  Do not  share needles.  Ask your health care provider for help if you need support or information about quitting drugs.  Tell your health care provider if you often feel depressed.  Tell your health care provider if you have ever been abused or do not feel safe at home. This information is not intended to replace advice given to you by your health care provider. Make sure you discuss any questions you have with your health care provider. Document Released: 08/15/2007 Document Revised: 10/16/2015 Document Reviewed: 11/20/2014  Chartered certified accountant Patient Education  [employer].

## 2017-05-07 NOTE — Progress Notes (Signed)
Provider: [name], NP  Patient Care Team: [name], NP as PCP - General (Geriatric Medicine)  Extended Emergency Contact Information Primary Emergency Contact: Venita Lick Address: [address]          [place], [place] [address] Johnnette Litter of [place] Phone: [phone] Relation: Spouse No Known Allergies Code Status: FULL Goals of Care: Advanced Directive information Advanced Directives 03/10/2017  Does Patient Have a Medical Advance Directive? No  Would patient like information on creating a medical advance directive? Yes (MAU/Ambulatory/Procedural Areas - Information given)     Chief Complaint  Patient presents with  . Medical Management of Chronic Issues    Pt is being seen for a physical.     HPI: Patient is a 62 y.o. male seen in today for an annual wellness exam.    Blood pressure elevated- recently dx with prostate cancer and having increase anxiety over this, does not take blood pressure at home. Has tried to cut back on sodium.   Depression screen PHQ 2/9 03/10/2017  Decreased Interest 0  Down, Depressed, Hopeless 0  PHQ - 2 Score 0    Fall Risk  05/07/2017 03/10/2017  Falls in the past year? No No   No flowsheet data found.   Health Maintenance  Topic Date Due  . TETANUS/TDAP  10/06/1974  . COLONOSCOPY  06/29/2017 (Originally 10/05/2005)  . INFLUENZA VACCINE  Completed  . Hepatitis C Screening  Completed  . HIV Screening  Completed    Does not do exercise but has an active part time job  Dentition: full dentures top and bottom  Pain: none.   Has colonoscopy scheduled.    Past Medical History:  Diagnosis Date  . Arthritis   . Chest pain 08/28/2016   had pleurisy  . Dyslipidemia   . Electrocardiogram suggestive of ST elevation myocardial infarction (STEMI) ([place]) 08/28/2016  . Elevated PSA    03/11/16 PSA of 98.0  . Essential hypertension   . Fatigue   . Hematuria    due to prostate  . Other visual disturbances   .  Overactive bladder   . Pericarditis    a. diagnosed in 07/2016 - cath showed no angiographic evidence of CAD.   Marland Kitchen Pneumothorax 1986  . Tobacco abuse   . Urinary urgency   . Vitamin D deficiency     Past Surgical History:  Procedure Laterality Date  . DENTAL SURGERY     all teeth removed/ has complete denture  . LEFT HEART CATH AND CORONARY ANGIOGRAPHY N/A 08/28/2016   Procedure: Left Heart Cath and Coronary Angiography;  Surgeon: Burnell Blanks, MD;  Location: [facility];  Service: Cardiovascular;  Laterality: N/A;  . WISDOM TOOTH EXTRACTION      Social History   Socioeconomic History  . Marital status: Married    Spouse name: None  . Number of children: 1  . Years of education: None  . Highest education level: None  Social Needs  . Financial resource strain: None  . Food insecurity - worry: None  . Food insecurity - inability: None  . Transportation needs - medical: None  . Transportation needs - non-medical: None  Occupational History  . None  Tobacco Use  . Smoking status: Current Some Day Smoker    Packs/day: 0.25    Years: 30.00    Pack years: 7.50  . Smokeless tobacco: Never Used  . Tobacco comment: smoke about 3 cigarettes daily  Substance and Sexual Activity  .  Alcohol use: Yes    Comment: seldomly- holidays/birthdays  . Drug use: Yes    Frequency: 2.0 times per week    Types: Marijuana    Comment: smokes marijuana 2-3 times a week to help with pain/stiffness in the mornings.   . Sexual activity: Yes  Other Topics Concern  . None  Social History Narrative   Social History      Diet?      Do you drink/eat things with caffeine? no      Marital status?              Married                      What year were you married? 2017      Do you live in a house, apartment, assisted living, condo, trailer, etc.?  house      Is it one or more stories? no      How many persons live in your home? 2      Do you have any pets in your home?  (please list) no      Highest level of education completed? 12th      Current or past profession: custodian       Do you exercise?          On my job                            Type & how often?      Advanced Directives      Do you have a living will? no      Do you have a DNR form?                                  If not, do you want to discuss one? no      Do you have signed POA/HPOA for forms? no      Functional Status      Do you have difficulty bathing or dressing yourself? no      Do you have difficulty preparing food or eating? no      Do you have difficulty managing your medications? no      Do you have difficulty managing your finances? no      Do you have difficulty affording your medications? no    Family History  Problem Relation Age of Onset  . Hypertension Mother   . Arthritis Mother   . Stroke Father   . Heart disease Father   . Diabetes Sister     Review of Systems:  Review of Systems  Constitutional: Negative for activity change, appetite change, fatigue and unexpected weight change.  HENT: Negative for congestion and hearing loss.   Eyes: Negative.   Respiratory: Negative for cough and shortness of breath.   Cardiovascular: Negative for chest pain, palpitations and leg swelling.  Gastrointestinal: Negative for abdominal pain, constipation and diarrhea.  Genitourinary: Negative for difficulty urinating and dysuria.  Musculoskeletal: Negative for arthralgias and myalgias.  Skin: Negative for color change and wound.  Neurological: Negative for dizziness and weakness.  Psychiatric/Behavioral: Negative for agitation, behavioral problems and confusion.     Allergies as of 05/07/2017   No Known Allergies     Medication List        Accurate as of 05/07/17  9:23 AM. Always use  your most recent med list.          AMBULATORY NON FORMULARY MEDICATION Prosvent: take 1 tablet by mouth daily for prostate health.   bisacodyl 5 MG EC tablet Commonly  known as:  DULCOLAX Take 5 mg by mouth. Dulcolax 5  mg bowel prep #4-Take as directed   CENTRUM PO Take 1 tablet by mouth daily.   diclofenac 75 MG EC tablet Commonly known as:  VOLTAREN Take 1 tablet (75 mg total) by mouth daily.   MIRALAX PO Take by mouth. Miralax 238 gm bowel prep-Take as directed         Physical Exam: Vitals:   05/07/17 0914 05/07/17 0957  BP: (!) 160/92 132/90  Pulse: 63   Temp: 98.6 F (37 C)   TempSrc: Oral   SpO2: 99%   Weight: 214 lb 6.4 oz (97.3 kg)   Height: 6\' 3"  (1.905 m)    Body mass index is 26.8 kg/m. Physical Exam  Constitutional: He is oriented to person, place, and time. He appears well-developed and well-nourished. No distress.  HENT:  Head: Normocephalic and atraumatic.  Mouth/Throat: Oropharynx is clear and moist. No oropharyngeal exudate.  Eyes: Conjunctivae and EOM are normal. Pupils are equal, round, and reactive to light.  Neck: Normal range of motion. Neck supple.  Cardiovascular: Normal rate, regular rhythm and normal heart sounds.  Pulmonary/Chest: Effort normal and breath sounds normal. No respiratory distress. He has no wheezes. He has no rales.  Abdominal: Soft. Bowel sounds are normal. He exhibits no distension. There is no tenderness.  Musculoskeletal: Normal range of motion. He exhibits no edema or tenderness.  Neurological: He is alert and oriented to person, place, and time. No cranial nerve deficit. Coordination normal.  Skin: Skin is warm and dry. He is not diaphoretic.  Psychiatric: He has a normal mood and affect.    Labs reviewed: Basic Metabolic Panel: Recent Labs    08/28/16 0301 08/28/16 0728 03/10/17 1001  NA 141 138 138  K 4.2 3.8 4.2  CL 109 108 108  CO2 24 22 24   GLUCOSE 110* 112* 83  BUN 14 10 15   CREATININE 0.88 0.74 0.70  CALCIUM 9.2 9.1 9.2   Liver Function Tests: Recent Labs    03/10/17 1001  AST 33  ALT 42  BILITOT 0.6  PROT 7.0   No results for input(s): LIPASE, AMYLASE  in the last 8760 hours. No results for input(s): AMMONIA in the last 8760 hours. CBC: Recent Labs    08/28/16 0301 08/28/16 0728 03/10/17 1001  WBC 11.4* 10.4 7.1  NEUTROABS  --   --  3,855  HGB 14.7 13.9 13.7  HCT 42.7 40.6 39.6  MCV 92.8 94.9 92.5  PLT 255 229 308   Lipid Panel: Recent Labs    08/28/16 0304 05/05/17 1000  CHOL 172 162  HDL 53 45  LDLCALC 106*  --   TRIG 63 60  CHOLHDL 3.2 3.6   Lab Results  Component Value Date   HGBA1C 5.5 03/11/2016    Procedures: No results found.  Assessment/Plan 1. Wellness examination Doing well today, recently dx with prostate cancer in the process of staging and following with urologist.  Colonoscopy scheduled.  The patient was counseled regarding the appropriate use of alcohol, regular self-examination of the breasts on a monthly basis, prevention of dental and periodontal disease, diet, regular sustained exercise for at least 30 minutes 5 times per week, tobacco use,  and recommended schedule for GI hemoccult testing,  colonoscopy, cholesterol, thyroid and diabetes screening.  2. Elevated blood pressure reading -improved on recheck, ?white coat syndrome. Plans to monitor at home and at upcoming OV and notify if elevated. DASH diet given and exercise encouraged.   Next appt: 10/08/2017 Carlos American. [place], [facility] [phone]

## 2017-05-10 ENCOUNTER — Encounter (HOSPITAL_COMMUNITY)
Admission: RE | Admit: 2017-05-10 | Discharge: 2017-05-10 | Disposition: A | Discharge status: Home / Self Care | Payer: Medicare HMO | Source: Ambulatory Visit | Attending: Urology | Admitting: Urology

## 2017-05-10 DIAGNOSIS — R3121 Asymptomatic microscopic hematuria: Secondary | ICD-10-CM | POA: Diagnosis not present

## 2017-05-10 DIAGNOSIS — C61 Malignant neoplasm of prostate: Secondary | ICD-10-CM | POA: Diagnosis not present

## 2017-05-10 MED ORDER — TECHNETIUM TC 99M MEDRONATE IV KIT
21.8000 | PACK | Freq: Once | INTRAVENOUS | Status: AC | PRN
  Administered 2017-05-10: 21.8 via INTRAVENOUS

## 2017-05-11 ENCOUNTER — Encounter: Payer: Medicare HMO | Admitting: Internal Medicine

## 2017-05-14 NOTE — Telephone Encounter (Signed)
OK No charge 

## 2017-05-17 ENCOUNTER — Other Ambulatory Visit (HOSPITAL_COMMUNITY): Payer: Medicare HMO

## 2017-05-17 ENCOUNTER — Encounter (HOSPITAL_COMMUNITY): Payer: Medicare HMO

## 2017-05-17 ENCOUNTER — Telehealth: Payer: Self-pay | Admitting: Medical Oncology

## 2017-05-17 NOTE — Telephone Encounter (Signed)
Left a message requesting a return call to discuss referral to the [facility]. 

## 2017-05-18 ENCOUNTER — Telehealth: Payer: Self-pay | Admitting: Medical Oncology

## 2017-05-18 ENCOUNTER — Encounter: Payer: Self-pay | Admitting: Medical Oncology

## 2017-05-18 NOTE — Telephone Encounter (Signed)
Aaron Adkins returned my call regarding referral to the [facility]. I  introduced myself as the Prostate Nurse Navigator and the Coordinator of the Prostate [place].  1. I confirmed with the patient he is aware of his referral to the clinic 05/25/17 arriving at 12:30pm.  2. I discussed the format of the clinic and the physicians he will be seeing that day.  3. I discussed where the clinic is located and how to contact me.  4. I confirmed his address and informed him I would be mailing a packet of information and forms to be completed. I asked him to bring them with him the day of his appointment.   He voiced understanding of the above. I asked him to call me if he has any questions or concerns regarding his appointments or the forms he needs to complete.

## 2017-05-20 ENCOUNTER — Encounter: Payer: Self-pay | Admitting: Radiation Oncology

## 2017-05-20 NOTE — Progress Notes (Signed)
GU Location of Tumor / Histology: prostatic adenocarcinoma  If Prostate Cancer, Gleason Score is (4 + 3) and PSA is (113) on 03/10/2017. Prostate volume: 89 grams.   03/11/2016  PSA  98  [place] was referred by Sherrie Mustache NP to Dr. Jeffie Pollock for evaluation of urinary urgency. He was found to have a PSA of 98 in January 2018 and was referred by Dr. Vista Lawman at that time but failed to come to 3 scheduled appointments over the last year.   Biopsies of prostate (if applicable) revealed:    Past/Anticipated interventions by urology, if any: prostate biopsy, referral to [facility]  Past/Anticipated interventions by medical oncology, if any: no  Weight changes, if any: no  Bowel/Bladder complaints, if any: urgency, nocturia x 2   Nausea/Vomiting, if any: no  Pain issues, if any:  no  SAFETY ISSUES:  Prior radiation? no  Pacemaker/ICD? no  Possible current pregnancy? no  Is the patient on methotrexate? no  Current Complaints / other details:  62 year old male. Married with one son.

## 2017-05-24 ENCOUNTER — Telehealth: Payer: Self-pay | Admitting: Medical Oncology

## 2017-05-24 ENCOUNTER — Encounter: Payer: Self-pay | Admitting: Medical Oncology

## 2017-05-24 ENCOUNTER — Telehealth: Payer: Self-pay | Admitting: Nurse Practitioner

## 2017-05-24 NOTE — Telephone Encounter (Signed)
Aaron Adkins called to confirm his appointment 05/25/17 for [name] arriving at 12:30pm. I reviewed [place] parking, registration process and reminded him to bring his completed medical forms. He asked if we could provide him with proof of appointments for his employer. I informed him we can print him a letter with this information. He voiced understanding.

## 2017-05-24 NOTE — Telephone Encounter (Signed)
Aaron Adkins Patient [phone]  [name] called in to inquire about getting Disability Parking placard, so I printed one for him to be filled out. Please call when ready for pickup, I also let him know there may be a fee. Placed in clinic intake box to be worked up.

## 2017-05-25 ENCOUNTER — Encounter: Payer: Self-pay | Admitting: General Practice

## 2017-05-25 ENCOUNTER — Ambulatory Visit
Admission: RE | Admit: 2017-05-25 | Discharge: 2017-05-25 | Disposition: A | Discharge status: Home / Self Care | Payer: Medicare HMO | Source: Ambulatory Visit | Attending: Radiation Oncology | Admitting: Radiation Oncology

## 2017-05-25 ENCOUNTER — Encounter: Payer: Self-pay | Admitting: Radiation Oncology

## 2017-05-25 ENCOUNTER — Inpatient Hospital Stay: Payer: Medicare HMO | Attending: Oncology | Admitting: Oncology

## 2017-05-25 ENCOUNTER — Encounter: Payer: Self-pay | Admitting: Medical Oncology

## 2017-05-25 ENCOUNTER — Other Ambulatory Visit: Payer: Self-pay

## 2017-05-25 DIAGNOSIS — C61 Malignant neoplasm of prostate: Secondary | ICD-10-CM

## 2017-05-25 DIAGNOSIS — E785 Hyperlipidemia, unspecified: Secondary | ICD-10-CM | POA: Diagnosis not present

## 2017-05-25 DIAGNOSIS — R972 Elevated prostate specific antigen [PSA]: Secondary | ICD-10-CM | POA: Diagnosis not present

## 2017-05-25 DIAGNOSIS — R69 Illness, unspecified: Secondary | ICD-10-CM | POA: Diagnosis not present

## 2017-05-25 HISTORY — DX: Malignant neoplasm of prostate: C61

## 2017-05-25 NOTE — Progress Notes (Signed)
                               Care Plan Summary  Name: Aaron Adkins, Aaron Adkins. DOB: 05-27-1955   Your Medical Team:   Urologist -  Dr. Raynelle Bring, [facility]  Radiation Oncologist - Dr. Tyler Pita, [facility]   Medical Oncologist - Dr. Zola Button, [place]  Recommendations: 1) Surgery with further treatment 2) Androgen deprivation (hormone injections) with      radiation 3) Radiation- 8 weeks  * These recommendations are based on information available as of today's consult.      Recommendations may change depending on the results of further tests or exams.  Next Steps: 1) Dr. Ralene Muskrat office will schedule hormone injections  2)Dr. Johny Shears office will schedule radiation planning    and treatments   When appointments need to be scheduled, you will be contacted by [facility] and/or [facility].  Patient was provided with business cards for all team members and a copy of "Fall Prevention Patient Safety Plan". Questions?  Please do not hesitate to call Cira Rue, RN, BSN, OCN at [phone] any questions or concerns.  Shirlean Mylar is your Oncology Nurse Navigator and is available to assist you while you're receiving your medical care at [facility].

## 2017-05-25 NOTE — Consult Note (Signed)
Multi-Disciplinary Clinic     05/25/2017   --------------------------------------------------------------------------------   Aaron Adkins  MRN: [id number]  PRIMARY CARE:  Aaron Mccreedy, MD  DOB: [birth year], 62 year old Male  REFERRING:    SSN: [id number]  PROVIDER:  [name], M.D.    TREATING:  Raynelle Bring, M.D.    LOCATION:  [facility] [phone]   --------------------------------------------------------------------------------   CC/HPI: CC: Prostate Cancer   Physician requesting consult: Dr. [name]  PCP: Dr. Doristine Section Bonsu  Location of consult: [facility] - Prostate Cancer Multidisciplinary [facility]   [name] is a 62 year old gentleman who initially had been referred for urologic evaluation in January 2018 for LUTS and an elevated PSA of 98 but he did not show to three separate appointments. He was again referred in February 2019 when his PSA was noted to be 113. He underwent a TRUS biopsy of the prostate on 04/28/17 that confirmed Gleason 4+3=7 adenocarcinoma with 12 out of 12 biopsy cores positive for malignancy.   Family history: None.   Imaging studies: 05/10/17  CT abdomen and pelvis: Left sided IVC, 12 mm left external iliac lymph node, 13 mm right external iliac lymph node  Bone scan: Negative.   PMH: He has a history of asthma, arthritis.  PSH: No abdominal surgeries.   TNM stage: cT1c N0 M0  PSA: 113  Gleason score: 4+3=7  Biopsy (04/28/17): 12/12 cores positive  Left: R lateral apex (10%, 3+3=6), R apex (20%, 3+3=6), R lateral mid (50%, 3+4=7), R mid (60%, 3+4=7), R lateral base (20%, 3+3=6), R base (5%, 3+3=6)  Right: R apex (20%, 3+4=7), R lateral apex (50%, 3+4=7), R mid (10%, 3+3=6), R lateral mid (50%, 3+4=7), R base (50%, 4+3=7), R lateral base (60%, 3+3=6)  Prostate volume: 89 cc with large median lobe   Nomogram  OC disease: 1%  EPE: 99%  SVI: 92%  LNI: 84%  PFS (5 year, 10 year): 19%, 11%   Urinary  function: IPSS is 11.  Erectile function: SHIM score is 22.     ALLERGIES: No Known Drug Allergies    MEDICATIONS: Aleve PRN  Diazepam 10 mg tablet 1-2 tablet PO Daily Take one hour prior to procedure.  Diclofenac Sodium     GU PSH: Complex Uroflow - 04/28/2017 Cystoscopy - 04/28/2017 Locm 300-399Mg /Ml Iodine,1Ml - 05/10/2017 Prostate Needle Biopsy - 04/28/2017, about 2009      PSH Notes: pneumothorax    NON-GU PSH: Surgical Pathology, Gross And Microscopic Examination For Prostate Needle - 04/28/2017    GU PMH: BPH w/LUTS, He has trilobar hyperplasia with a long prostatic urethra and a reduced stream but he is emptying well. - 04/28/2017 Elevated PSA, I will call him with the biopsy results. There is a suggestion of locally advanced disease on the left. He will need staging studies if the biopsy is positive which I anticipate it will be. - 04/28/2017, Markedly elevated PSA with a benign exam. I am going to get him set up for a prostate [place] and biopsy and reviewed the risks of bleeding, infection and voiding diifficulty. Levaquin and valium sent. , - 03/30/2017 Microscopic hematuria, He has urgency and microhematuria. I am going to get him set up for a CT hematuria study and cystoscopy with a flowrate and PVR. - 03/30/2017 Urinary Urgency - 03/30/2017    NON-GU PMH: Arthritis Asthma    FAMILY HISTORY: Death of family member - Father Hypertension - Mother, Father  Notes: 1 son   SOCIAL HISTORY: Marital Status: Married Preferred Language: English; Ethnicity: Not Hispanic Or Latino; Race: Black or African American Current Smoking Status: Patient smokes. Has smoked since 03/02/1977. Smokes less than 1/2 pack per day.   Tobacco Use Assessment Completed: Used Tobacco in last 30 days? Does not use smokeless tobacco. Social Drinker.  Drinks 1 caffeinated drink per day. 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    REVIEW OF SYSTEMS:    GU Review Male:   Patient denies frequent urination,  hard to postpone urination, burning/ pain with urination, get up at night to urinate, leakage of urine, stream starts and stops, trouble starting your streams, and have to strain to urinate .  Gastrointestinal (Upper):   Patient denies nausea and vomiting.  Gastrointestinal (Lower):   Patient denies diarrhea and constipation.  Constitutional:   Patient denies fever, night sweats, weight loss, and fatigue.  Skin:   Patient denies skin rash/ lesion and itching.  Eyes:   Patient denies blurred vision and double vision.  Ears/ Nose/ Throat:   Patient denies sore throat and sinus problems.  Hematologic/Lymphatic:   Patient denies swollen glands and easy bruising.  Cardiovascular:   Patient denies leg swelling and chest pains.  Respiratory:   Patient denies cough and shortness of breath.  Endocrine:   Patient denies excessive thirst.  Musculoskeletal:   Patient denies back pain and joint pain.  Neurological:   Patient denies headaches and dizziness.  Psychologic:   Patient denies depression and anxiety.   VITAL SIGNS: None   MULTI-SYSTEM PHYSICAL EXAMINATION:    Constitutional: Well-nourished. No physical deformities. Normally developed. Good grooming.     PAST DATA REVIEWED:  Source Of History:  Patient  Lab Test Review:   PSA  Records Review:   Pathology Reports, Previous Patient Records  X-Ray Review: C.T. Abdomen/Pelvis: Reviewed Films.  Bone Scan: Reviewed Films.     03/10/17 03/11/16  PSA  Total PSA 113 ng/dl 98 ng/dl    PROCEDURES: None   ASSESSMENT:      ICD-10 Details  1 GU:   Prostate Cancer - C61    PLAN:           Document Letter(s):  Created for Patient: Clinical Summary   Created for Patient: Clinical Summary         Notes:   1. High-risk prostate cancer with possible lymph node involvement: I had a detailed discussion with Mr. Candelas and his wife today. His case has been extensively reviewed in the multidisciplinary conference this afternoon. We did discuss the  option of further imaging with fluciclovine although this has apparently already been ordered and denied. In the absence of being able to proceed with additional imaging, it was recommended that he proceed with therapy of curative intent. He was explained that he likely would require multimodality therapy and would be at a higher risk for failure based on his disease parameters.   The patient was counseled about the natural history of prostate cancer and the standard treatment options that are available for prostate cancer. It was explained to him how his age and life expectancy, clinical stage, Gleason score, and PSA affect his prognosis, the decision to proceed with additional staging studies, as well as how that information influences recommended treatment strategies. We discussed the roles for active surveillance, radiation therapy, surgical therapy, androgen deprivation, as well as ablative therapy options for the treatment of prostate cancer as appropriate to his individual cancer situation. We discussed the risks and benefits  of these options with regard to their impact on cancer control and also in terms of potential adverse events, complications, and impact on quality of life particularly related to urinary and sexual function. The patient was encouraged to ask questions throughout the discussion today and all questions were answered to his stated satisfaction. In addition, the patient was provided with and/or directed to appropriate resources and literature for further education about prostate cancer and treatment options.   Ultimately, he and his wife are leaning toward long-term androgen deprivation radiation therapy understanding that the likelihood with surgical therapy alone would be exceedingly small. I will plan to notify Dr. Jeffie Pollock of our discussion today. He is going to notify us once he has made a final decision. If he does reconsider and elects to consider surgical treatment, I will have him  follow up for further discussion and examination.   Cc: Dr. [name]  Dr. Iona Beard Osei-Bonsu  Dr. Ledon Snare  Dr. Zola Button

## 2017-05-25 NOTE — Progress Notes (Signed)
Reason for Referral: Prostate cancer  HPI: 62 year old native of [place] but currently lives in [place] for the last 2 years.  He is a pleasant gentleman with history of hypertension and arthritis and was found to have an elevated PSA after developing lower urinary tract symptoms in January 2018.  At that time his PSA was 98 and subsequently was as high as 113 in February 2019.  A biopsy obtained at that time by Dr. Jeffie Pollock which showed high volume disease with prostate cancer involving 12 cores out of 12 with a Gleason score of 4+3 = 7 in the majority of them.  He is a staging workup showed a negative bone scan without any evidence of metastatic disease and CT scan of the abdomen and pelvis did show a 1.3 cm external iliac lymph node.  Based on these findings patient referred for prostate cancer multidisciplinary clinic.  Clinically, he continues to have lower urinary tract symptoms but has improved since his biopsy.  He denies any hematuria or dysuria but does report frequency and nocturia.  He remains active and continues to attend to activities of daily living.   does not report any headaches, blurry vision, syncope or seizures. Does not report any fevers, chills or sweats.  Does not report any cough, wheezing or hemoptysis.  Does not report any chest pain, palpitation, orthopnea or leg edema.  Does not report any nausea, vomiting or abdominal pain.  Does not report any constipation or diarrhea.  Does not report any skeletal complaints.    Does not report frequency, urgency or hematuria.  Does not report any skin rashes or lesions. Does not report any heat or cold intolerance.  Does not report any lymphadenopathy or petechiae.  Does not report any anxiety or depression.  Remaining review of systems is negative.    Past Medical History:  Diagnosis Date  . Arthritis   . Chest pain 08/28/2016   had pleurisy  . Dyslipidemia   . Electrocardiogram suggestive of ST elevation myocardial infarction  (STEMI) ([facility]) 08/28/2016  . Elevated PSA    03/11/16 PSA of 98.0  . Essential hypertension   . Fatigue   . Hematuria    due to prostate  . Other visual disturbances   . Overactive bladder   . Pericarditis    a. diagnosed in 07/2016 - cath showed no angiographic evidence of CAD.   Marland Kitchen Pneumothorax 1986  . Prostate cancer ([facility])   . Tobacco abuse   . Urinary urgency   . Vitamin D deficiency   :  Past Surgical History:  Procedure Laterality Date  . DENTAL SURGERY     all teeth removed/ has complete denture  . LEFT HEART CATH AND CORONARY ANGIOGRAPHY N/A 08/28/2016   Procedure: Left Heart Cath and Coronary Angiography;  Surgeon: Burnell Blanks, MD;  Location: [facility];  Service: Cardiovascular;  Laterality: N/A;  . PROSTATE BIOPSY    . WISDOM TOOTH EXTRACTION    :   Current Outpatient Medications:  .  AMBULATORY NON FORMULARY MEDICATION, Prosvent: take 1 tablet by mouth daily for prostate health., Disp: , Rfl:  .  bisacodyl (DULCOLAX) 5 MG EC tablet, Take 5 mg by mouth. Dulcolax 5  mg bowel prep #4-Take as directed, Disp: , Rfl:  .  diclofenac (VOLTAREN) 75 MG EC tablet, Take 1 tablet (75 mg total) by mouth daily., Disp: 30 tablet, Rfl: 2 .  Multiple Vitamins-Minerals (CENTRUM PO), Take 1 tablet by mouth daily., Disp: , Rfl:  .  Polyethylene Glycol 3350 (MIRALAX PO), Take by mouth. Miralax 238 gm bowel prep-Take as directed, Disp: , Rfl: :  No Known Allergies:  Family History  Problem Relation Age of Onset  . Hypertension Mother   . Arthritis Mother   . Stroke Father   . Heart disease Father   . Diabetes Sister   :  Social History   Socioeconomic History  . Marital status: Married    Spouse name: Not on file  . Number of children: 1  . Years of education: Not on file  . Highest education level: Not on file  Occupational History  . Not on file  Social Needs  . Financial resource strain: Not on file  . Food insecurity:    Worry: Not on file     Inability: Not on file  . Transportation needs:    Medical: Not on file    Non-medical: Not on file  Tobacco Use  . Smoking status: Current Some Day Smoker    Packs/day: 0.25    Years: 30.00    Pack years: 7.50  . Smokeless tobacco: Never Used  . Tobacco comment: smoke about 3 cigarettes daily  Substance and Sexual Activity  . Alcohol use: Yes    Comment: seldomly- holidays/birthdays  . Drug use: Yes    Frequency: 2.0 times per week    Types: Marijuana    Comment: smokes marijuana 2-3 times a week to help with pain/stiffness in the mornings.   . Sexual activity: Yes  Lifestyle  . Physical activity:    Days per week: Not on file    Minutes per session: Not on file  . Stress: Not on file  Relationships  . Social connections:    Talks on phone: Not on file    Gets together: Not on file    Attends religious service: Not on file    Active member of club or organization: Not on file    Attends meetings of clubs or organizations: Not on file    Relationship status: Not on file  . Intimate partner violence:    Fear of current or ex partner: Not on file    Emotionally abused: Not on file    Physically abused: Not on file    Forced sexual activity: Not on file  Other Topics Concern  . Not on file  Social History Narrative   Social History      Diet?      Do you drink/eat things with caffeine? no      Marital status?              Married                      What year were you married? 2017      Do you live in a house, apartment, assisted living, condo, trailer, etc.?  house      Is it one or more stories? no      How many persons live in your home? 2      Do you have any pets in your home? (please list) no      Highest level of education completed? 12th      Current or past profession: custodian       Do you exercise?          On my job  Type & how often?      Advanced Directives      Do you have a living will? no      Do you have a  DNR form?                                  If not, do you want to discuss one? no      Do you have signed POA/HPOA for forms? no      Functional Status      Do you have difficulty bathing or dressing yourself? no      Do you have difficulty preparing food or eating? no      Do you have difficulty managing your medications? no      Do you have difficulty managing your finances? no      Do you have difficulty affording your medications? no  :  Pertinent items are noted in HPI.  Exam: ECOG 0 General appearance: alert and cooperative appeared without distress. Head: atraumatic without any abnormalities. Eyes: conjunctivae/corneas clear. PERRL.  Sclera anicteric. Throat: lips, mucosa, and tongue normal; without oral thrush or ulcers. Resp: clear to auscultation bilaterally without rhonchi, wheezes or dullness to percussion. Cardio: regular rate and rhythm, S1, S2 normal, no murmur, click, rub or gallop GI: soft, non-tender; bowel sounds normal; no masses,  no organomegaly Skin: Skin color, texture, turgor normal. No rashes or lesions Lymph nodes: Cervical, supraclavicular, and axillary nodes normal. Neurologic: Grossly normal without any motor, sensory or deep tendon reflexes. Musculoskeletal: No joint deformity or effusion.  CBC    Component Value Date/Time   WBC 7.1 03/10/2017 1001   RBC 4.28 03/10/2017 1001   HGB 13.7 03/10/2017 1001   HCT 39.6 03/10/2017 1001   PLT 308 03/10/2017 1001   MCV 92.5 03/10/2017 1001   MCH 32.0 03/10/2017 1001   MCHC 34.6 03/10/2017 1001   RDW 12.7 03/10/2017 1001   LYMPHSABS 2,308 03/10/2017 1001   EOSABS 213 03/10/2017 1001   BASOSABS 43 03/10/2017 1001     Chemistry      Component Value Date/Time   NA 138 03/10/2017 1001   NA 143 03/11/2016   K 4.2 03/10/2017 1001   CL 108 03/10/2017 1001   CO2 24 03/10/2017 1001   BUN 15 03/10/2017 1001   BUN 11 03/11/2016   CREATININE 0.70 03/10/2017 1001   GLU 79 03/11/2016      Component  Value Date/Time   CALCIUM 9.2 03/10/2017 1001   ALKPHOS 73 03/11/2016   AST 33 03/10/2017 1001   ALT 42 03/10/2017 1001   BILITOT 0.6 03/10/2017 1001       Nm Bone Scan Whole Body  Result Date: 05/11/2017 CLINICAL DATA:  Prostate cancer. EXAM: NUCLEAR MEDICINE WHOLE BODY BONE SCAN TECHNIQUE: Whole body anterior and posterior images were obtained approximately 3 hours after intravenous injection of radiopharmaceutical. RADIOPHARMACEUTICALS:  21.8 mCi Technetium-20m MDP IV COMPARISON:  CT 05/10/2017. FINDINGS: Bilateral renal function excretion. Mild increased activity noted over the kidneys bilaterally most likely in the renal collecting system. No hydronephrosis noted on today's CT. Mild increased activity noted about the right knee, most likely degenerative. No focal bony abnormalities noted to suggest metastatic disease. IMPRESSION: No evidence of bony metastatic disease. Electronically Signed   By: Marcello Moores  Register   On: 05/11/2017 07:58    Assessment and Plan:   62 year old gentleman with high-risk high volume prostate cancer diagnosed  in February 2019.  His Gleason score 4+3 = 7 with 12 out of 12 cores involved with his disease.  His PSA is 113.  His case was discussed today in the prostate cancer multidisciplinary clinic.  His pathology was discussed with the reviewing pathologist and imaging studies were also reviewed with radiology.  It is concerning that he has external iliac lymph nodes that are slightly enlarged and stage IV disease is not completely ruled out at this time.  But given the fact that there is no clear cut evidence of advanced disease, definitive therapy is warranted.  Primary surgical therapy will not likely be curative and will require from modality approach.  Radiation and androgen deprivation therapy for at least 18 months is a reasonable alternative.  Complication associated with androgen deprivation was reviewed today.  These include weight gain, hot flashes,  sexual dysfunction and metabolic syndrome among others.  He is leaning towards this approach or surgery.  Additional systemic therapy with Zytiga or chemotherapy is not warranted at this time unless documented metastatic disease is noted.  He understands he is high risk of developing metastatic disease and at that time additional systemic therapy may be needed.  For his questions are answered today to his satisfaction.  30  minutes was spent with the patient face-to-face today.  More than 50% of time was dedicated to patient counseling, education and coordination of his care.

## 2017-05-25 NOTE — Telephone Encounter (Signed)
Application for Handicap Placard received and placed in [place] folder for review and signature.

## 2017-05-25 NOTE — Progress Notes (Signed)
Radiation Oncology         [phone] ________________________________  Multidisciplinary Prostate Cancer Clinic  Initial Radiation Oncology Consultation  Name: Aaron Adkins MRN: [id number]  Date: 05/25/2017  DOB: Apr 11, 1955  YK:DXIPJAS, Carlos American, NP  Raynelle Bring, MD   REFERRING PHYSICIAN: Raynelle Bring, MD  DIAGNOSIS: 62 y.o. gentleman with stage T1c adenocarcinoma of the prostate with a Gleason's score of 4+3 and a PSA of 113.    ICD-10-CM   1. Malignant neoplasm of prostate ([facility]) C61     HISTORY OF PRESENT ILLNESS::Aaron Adkins is a 62 y.o. gentleman who was noted to have an elevated PSA of 98 by his primary care physician, Dr. Vista Lawman in January 2018. Accordingly, he was referred for evaluation in urology at that time but failed to come to three scheduled appointments over the last year. He presented to Dr. Jeffie Pollock on 03/30/17 with hematuria and voiding symptoms. His PSA at this time was 113, on 03/10/17. A digital rectal examination was performed at that time revealing symmetrical prostate lobes without discrete nodularity or induration.  The patient proceeded to transrectal ultrasound with 12 biopsies of the prostate on 04/28/17. The prostate volume measured 89 cc. Out of 12 core biopsies,12 were positive.  The maximum Gleason score was 4+3, and this was seen in right base.  Additionally, there was Gleason 3+4 disease in the left mid, left mid lateral, right mid lateral, right apex and right apex lateral as well as Gleason 3+3 disease in the left base, left base lateral, right base lateral, right mid, left apex and left apex lateral.    He underwent CT and bone scan imaging for disease staging.  CT of the abdomen and pelvis on 05/10/17 showed no acute process or explanation for hematuria but did show some mild bilateral external iliac adenopathy with the left external iliac node measuring 12 mm in the right external iliac node measuring 13 mm. No abdominal metastatic  disease identified. Whole body bone scan on 05/10/17 showed no evidence of bony metastatic disease.   The patient reviewed the biopsy results with his urologist and he has kindly been referred today to the multidisciplinary prostate cancer clinic for presentation of pathology and radiology studies in our conference for discussion of potential radiation treatment options and clinical evaluation.  His wife accompanies him today for this visit.   PREVIOUS RADIATION THERAPY: No  PAST MEDICAL HISTORY:  has a past medical history of Arthritis, Chest pain (08/28/2016), Dyslipidemia, Electrocardiogram suggestive of ST elevation myocardial infarction (STEMI) ([facility]) (08/28/2016), Elevated PSA, Essential hypertension, Fatigue, Hematuria, Other visual disturbances, Overactive bladder, Pericarditis, Pneumothorax (1986), Prostate cancer ([facility]), Tobacco abuse, Urinary urgency, and Vitamin D deficiency.    PAST SURGICAL HISTORY: Past Surgical History:  Procedure Laterality Date  . DENTAL SURGERY     all teeth removed/ has complete denture  . LEFT HEART CATH AND CORONARY ANGIOGRAPHY N/A 08/28/2016   Procedure: Left Heart Cath and Coronary Angiography;  Surgeon: Burnell Blanks, MD;  Location: [facility];  Service: Cardiovascular;  Laterality: N/A;  . PROSTATE BIOPSY    . WISDOM TOOTH EXTRACTION      FAMILY HISTORY: family history includes Arthritis in his mother; Cancer in his maternal grandfather; Diabetes in his sister; Heart disease in his father; Hypertension in his mother; Stroke in his father.  SOCIAL HISTORY:  reports that he quit smoking about 2 weeks ago. His smoking use included cigarettes. He has a 8.75 pack-year smoking history. He has never used  smokeless tobacco. He reports that he drinks alcohol. He reports that he has current or past drug history. Drug: Marijuana. Frequency: 2.00 times per week.  ALLERGIES: Patient has no known allergies.  MEDICATIONS:  Current Outpatient  Medications  Medication Sig Dispense Refill  . Ascorbic Acid (VITAMIN C) 1000 MG tablet Take 1,000 mg by mouth daily. Takes two tablets per day    . diclofenac (VOLTAREN) 75 MG EC tablet Take 1 tablet (75 mg total) by mouth daily. 30 tablet 2  . Multiple Vitamins-Minerals (CENTRUM PO) Take 1 tablet by mouth daily.    . AMBULATORY NON FORMULARY MEDICATION Prosvent: take 1 tablet by mouth daily for prostate health.     No current facility-administered medications for this encounter.     REVIEW OF SYSTEMS:  On review of systems, the patient reports that he is doing well overall. He is positive for joint pain and arthritis. He denies any chest pain, shortness of breath, cough, fevers, chills, night sweats, unintended weight changes. He denies any bowel disturbances, and denies abdominal pain, nausea or vomiting. He denies any new musculoskeletal or joint aches or pains. His IPSS was 11, indicating moderate urinary symptoms. He is able to complete sexual activity with most attempts. A complete review of systems is obtained and is otherwise negative.   PHYSICAL EXAM:  Wt Readings from Last 3 Encounters:  05/25/17 211 lb 6.4 oz (95.9 kg)  05/07/17 214 lb 6.4 oz (97.3 kg)  04/27/17 211 lb 3.2 oz (95.8 kg)   Temp Readings from Last 3 Encounters:  05/25/17 98 F (36.7 C) (Oral)  05/07/17 98.6 F (37 C) (Oral)  03/10/17 98.4 F (36.9 C) (Oral)   BP Readings from Last 3 Encounters:  05/25/17 119/84  05/07/17 132/90  03/10/17 (!) 142/92   Pulse Readings from Last 3 Encounters:  05/25/17 68  05/07/17 63  03/10/17 71   Pain Assessment Pain Score: 0-No pain/10  In general this is a well appearing African-American male in no acute distress.  He is alert and oriented x4 and appropriate throughout the examination. HEENT reveals that the patient is normocephalic, atraumatic. EOMs are intact. PERRLA. Skin is intact without any evidence of gross lesions. Cardiovascular exam reveals a regular rate  and rhythm, no clicks rubs or murmurs are auscultated. Chest is clear to auscultation bilaterally. Lymphatic assessment is performed and does not reveal any adenopathy in the cervical, supraclavicular, axillary, or inguinal chains. Abdomen has active bowel sounds in all quadrants and is intact. The abdomen is soft, non tender, non distended. Lower extremities are negative for pretibial pitting edema, deep calf tenderness, cyanosis or clubbing.  KPS = 100  100 - Normal; no complaints; no evidence of disease. 90   - Able to carry on normal activity; minor signs or symptoms of disease. 80   - Normal activity with effort; some signs or symptoms of disease. 34   - Cares for self; unable to carry on normal activity or to do active work. 60   - Requires occasional assistance, but is able to care for most of his personal needs. 50   - Requires considerable assistance and frequent medical care. 9   - Disabled; requires special care and assistance. 76   - Severely disabled; hospital admission is indicated although death not imminent. 14   - Very sick; hospital admission necessary; active supportive treatment necessary. 10   - Moribund; fatal processes progressing rapidly. 0     - Dead  Karnofsky DA, Abelmann Firth, Craver  LS and Burchenal JH (1948) The use of the nitrogen mustards in the palliative treatment of carcinoma: with particular reference to bronchogenic carcinoma Cancer 1 634-56   LABORATORY DATA:  Lab Results  Component Value Date   WBC 7.1 03/10/2017   HGB 13.7 03/10/2017   HCT 39.6 03/10/2017   MCV 92.5 03/10/2017   PLT 308 03/10/2017   Lab Results  Component Value Date   NA 138 03/10/2017   K 4.2 03/10/2017   CL 108 03/10/2017   CO2 24 03/10/2017   Lab Results  Component Value Date   ALT 42 03/10/2017   AST 33 03/10/2017   ALKPHOS 73 03/11/2016   BILITOT 0.6 03/10/2017     RADIOGRAPHY: Nm Bone Scan Whole Body  Result Date: 05/11/2017 CLINICAL DATA:  Prostate cancer.  EXAM: NUCLEAR MEDICINE WHOLE BODY BONE SCAN TECHNIQUE: Whole body anterior and posterior images were obtained approximately 3 hours after intravenous injection of radiopharmaceutical. RADIOPHARMACEUTICALS:  21.8 mCi Technetium-68m MDP IV COMPARISON:  CT 05/10/2017. FINDINGS: Bilateral renal function excretion. Mild increased activity noted over the kidneys bilaterally most likely in the renal collecting system. No hydronephrosis noted on today's CT. Mild increased activity noted about the right knee, most likely degenerative. No focal bony abnormalities noted to suggest metastatic disease. IMPRESSION: No evidence of bony metastatic disease. Electronically Signed   By: Marcello Moores  Register   On: 05/11/2017 07:58      IMPRESSION/PLAN: 62 y.o. gentleman with a high risk, stage T1c adenocarcinoma of the prostate with a PSA of 113 and a Gleason score of 4+3.  We discussed the patient's workup and outlines the nature of prostate cancer in this setting. The patient's T stage, Gleason's score, and PSA put him into the high risk group. Accordingly, he is eligible for a variety of potential treatment options including prostatectomy or LT-ADT in combination with 8 weeks of external radiation. We discussed the available radiation techniques, and focused on the details and logistics and delivery. The patient may is not an ideal candidate for brachytherapy with a prostate volume of 89 cc prior to downsizing from hormone therapy.  We discussed and outlined the risks, benefits, short and long-term effects associated with radiotherapy and compared and contrasted these with prostatectomy. We discussed the role of SpaceOAR in reducing the rectal toxicity associated with radiotherapy. We also detailed the role of ADT in the treatment of high risk prostate cancer and outlined the associated side effects that could be expected with this therapy.  At the end of the conversation the patient is undecided regarding his treatment preference  but appears to be leaning towards LT-ADT in combination with 8 weeks of external radiotherapy. He has not received his first Lupron injection and has not had placement of gold fiducial markers.  We will plan to follow-up with the patient by phone in the next 2 weeks to ascertain his treatment preference.  We will share our discussion with Dr. Jeffie Pollock and move forward with appropriate treatment scheduling pending his final decision.   More than 50% of today's visit was spent in counseling and/or coordination of care.     Nicholos Johns, PA-C    Tyler Pita, MD  [facility] Direct Dial: [phone]  Fax: [phone] [email or web address]  Skype  [email or web address]  This document serves as a record of services personally performed by Tyler Pita, MD and Freeman Caldron, PA-C. It was created on their behalf by Bethann Humble, a trained medical scribe. The creation of this record is based  on the scribe's personal observations and the provider's statements to them. This document has been checked and approved by the attending provider.

## 2017-05-26 ENCOUNTER — Telehealth: Payer: Self-pay

## 2017-05-26 NOTE — Telephone Encounter (Signed)
Appointment scheduled for Monday 05/31/17. Disability parking placard application was placed in folder #1 in filing cabinet.

## 2017-05-26 NOTE — Telephone Encounter (Signed)
There are specific criteria are listed and you have to meet these unfortunately  based on these he does not qualify for handicap placard.

## 2017-05-26 NOTE — Telephone Encounter (Signed)
Patient states that he has problems with his feet swelling and he cannot walk far. I informed patient that he may need to schedule an appointment to address this swelling because this has not been discussed with provider. Please advise if patient needs to schedule an appointment.

## 2017-05-26 NOTE — Telephone Encounter (Signed)
Noted, yes sounds good

## 2017-05-26 NOTE — Telephone Encounter (Signed)
I spoke with patient to let him know that Janett Billow stated that he would not qualify for a disability parking placard. Patient does not agree with this assessment. He stated that he is a cancer patient and he cannot walk long distances. He was like Janett Billow to reconsider completing placard application or give him a specific reason for why he does not qualify.

## 2017-05-26 NOTE — Telephone Encounter (Signed)
Per 3/26 no los 

## 2017-05-31 ENCOUNTER — Other Ambulatory Visit: Payer: Self-pay | Admitting: Radiation Oncology

## 2017-05-31 ENCOUNTER — Telehealth: Payer: Self-pay | Admitting: Medical Oncology

## 2017-05-31 ENCOUNTER — Ambulatory Visit: Payer: Self-pay | Admitting: Nurse Practitioner

## 2017-05-31 NOTE — Progress Notes (Signed)
[facility] Psychosocial Distress Screening Spiritual Care  Met with Aaron Adkins and his wife in [facility] to introduce [facility] team/resources, reviewing distress screen per protocol.  The patient scored a 6 on the Psychosocial Distress Thermometer which indicates moderate distress. Also assessed for distress and other psychosocial needs.   ONCBCN DISTRESS SCREENING 05/31/2017  Screening Type Initial Screening  Distress experienced in past week (1-10) 6  Emotional problem type Nervousness/Anxiety;Adjusting to illness  Information Concerns Type Lack of info about diagnosis;Lack of info about treatment  Referral to support programs Yes   Aaron Adkins is particularly interested in connecting with people who have had similar dx and tx decisions. Normalized feelings, providing emotional support and speaking in detail about Prostate Cancer Support Group as a means for both pt and spouse to receive support and encouragement from other pts, survivors, and spouses.    Follow up needed: No. Couple repeated interest in support group and also discussed value of community-building support programming in general. Per couple, no other needs at this time, but please page if needs arise or circumstances change. Thank you.   [place], [place], [facility] Pager [phone] Voicemail [phone]

## 2017-05-31 NOTE — Telephone Encounter (Signed)
[name] called stating he would like to move forward with LT-ADT and radiation. I informed him that Dr. Ralene Muskrat office will schedule the hormone injections. He asked if he eats a healthy diet, will he still need ADT.I dicussed the importance of eating  a healthy diet and the purpose of ADT. He voiced understanding. He asked if I could have someone call him that has received the same treatment he is undergoing. I forwarded treatment decision to Dr. Tammi Klippel.

## 2017-06-01 ENCOUNTER — Encounter: Payer: Self-pay | Admitting: Urology

## 2017-06-01 ENCOUNTER — Telehealth: Payer: Self-pay | Admitting: *Deleted

## 2017-06-01 NOTE — Progress Notes (Signed)
The patient is elected to proceed with long-term androgen deprivation therapy in combination with 8 weeks of prostate IMRT.  I have sent a message to Romie Jumper to reach out to the patient to begin coordinating a follow-up visit with Dr. Jeffie Pollock within the next 7-10 days to begin ADT.  Once we have a date for ADT, we wcan proceed with coordinating fiducial marker and SoaceOAR placement in the outpatient surgical setting approximately 4-6 weeks out from starting ADT and a CT SIM appointment thereafter in anticipation of beginning 8 weeks of prostate IMRT 2 months from start of ADT. Ailene Ards

## 2017-06-01 NOTE — Telephone Encounter (Signed)
CALLED PATIENT TO INFORM OF HORMONE INJ. ON 06-14-17- ARRIVAL TIME 1 PM @ DR. WRENN'S OFFICE, SPOKE WITH PATIENT AND HE IS AWARE OF THIS APPT.

## 2017-06-03 ENCOUNTER — Telehealth: Payer: Self-pay

## 2017-06-03 NOTE — Telephone Encounter (Signed)
Patient had a pending appointment scheduled for Dani Gobble on 06/04/17 that was canceled due to patient being out of town. Next appointment with Janett Billow is pending for January 2020.   Left message on voicemail for patient to return call when available, reason for call ask patient if he would like a separate visit prior to January 2020 to discuss need for placard.   Side Notes: Patient had previously dropped off a handicap placard/disability parking and requested that we call when ready for pick-up. Per [name], NP patient does not qualify for placard.   Jannet Mantis spoke with patient to relay this information and patient felt that he does qualify. Message will be routed to Norwood Levo to document patient's reason as to why he feels he does qualify. The end conclusion was that patient needed to schedule appointment to further address his request.

## 2017-06-03 NOTE — Telephone Encounter (Signed)
Noted, let us know if he needs to be seen sooner than scheduled.

## 2017-06-03 NOTE — Telephone Encounter (Signed)
Please see phone note for 05/26/17. Patient disagreed with Janett Billow stating that he would not qualify for the disability parking placard because his feet swelled and he could not walk for long without having to rest.

## 2017-06-04 ENCOUNTER — Ambulatory Visit: Payer: Medicare HMO | Admitting: Nurse Practitioner

## 2017-06-08 ENCOUNTER — Telehealth: Payer: Self-pay | Admitting: Medical Oncology

## 2017-06-08 NOTE — Telephone Encounter (Signed)
Aaron Adkins asked if it would be possible to have a patient that has received ADT and radiation to call him to share their experience. I spoke with Aaron Adkins a former patient and he is happy to contact Aaron Adkins. I shared this with patient and I also encouraged him to attend the prostate support group where he can meet and discuss treatment with members. He voiced appreciation for my efforts and I asked him to call me back if I can assist him further. He is scheduled to receive his first hormone injection 06/14/17 at Dr. Ralene Muskrat office.

## 2017-06-10 ENCOUNTER — Other Ambulatory Visit: Payer: Self-pay | Admitting: Nurse Practitioner

## 2017-06-14 ENCOUNTER — Telehealth: Payer: Self-pay | Admitting: Medical Oncology

## 2017-06-14 DIAGNOSIS — C61 Malignant neoplasm of prostate: Secondary | ICD-10-CM | POA: Diagnosis not present

## 2017-06-14 NOTE — Telephone Encounter (Signed)
Patient asking about getting FMLA paper work completed. I explained that Dr. Tammi Klippel will be happy to complete when he begins radiation. He is asking about getting it completed today after ADT. I asked him to speak with Dr. Ralene Muskrat office since he will get ADT there. He voiced understanding. He states he did receive a call from Mr. Darnell Level Data processing manager).

## 2017-06-21 ENCOUNTER — Encounter: Payer: Self-pay | Admitting: Medical Oncology

## 2017-07-01 NOTE — Telephone Encounter (Signed)
FYI

## 2017-07-15 DIAGNOSIS — C61 Malignant neoplasm of prostate: Secondary | ICD-10-CM | POA: Diagnosis not present

## 2017-07-21 DIAGNOSIS — C61 Malignant neoplasm of prostate: Secondary | ICD-10-CM | POA: Diagnosis not present

## 2017-07-21 DIAGNOSIS — Z5111 Encounter for antineoplastic chemotherapy: Secondary | ICD-10-CM | POA: Diagnosis not present

## 2017-07-21 DIAGNOSIS — R3121 Asymptomatic microscopic hematuria: Secondary | ICD-10-CM | POA: Diagnosis not present

## 2017-07-21 DIAGNOSIS — R351 Nocturia: Secondary | ICD-10-CM | POA: Diagnosis not present

## 2017-07-30 ENCOUNTER — Telehealth: Payer: Self-pay | Admitting: Urology

## 2017-07-30 NOTE — Telephone Encounter (Signed)
I spoke with [name] over the phone and he request to postpone starting radiation treatments.  He was scheduled for fiducial markers and SpaceOAR on 08/03/2017 with Dr. Jeffie Pollock but has called to cancel that appointment already.  He feels that things are moving too fast and he wishes to postpone starting radiation until after his follow-up visit with Dr. Jeffie Pollock in August 2019.  Fortunately, he has already started androgen deprivation therapy and plans to remain on this but it sounds like he is having a tough time dealing with the associated side effects of hot flashes and fatigue.  I advised that I will share this information with Dr. Tammi Klippel and Dr. Jeffie Pollock and will have Enid Derry contact him to reschedule the fiducial markers and SpaceOAR procedure for sometime in late August 2019, as per his request.   Nicholos Johns, PA-C

## 2017-07-30 NOTE — Telephone Encounter (Signed)
I called and left a message on the patient's voicemail informing him that I am happy to answer any additional questions that he may have regarding the appointment scheduled with [facility] for fiducial marker placement and SpaceOAR gel.  Apparently, when [facility] called to give him the date for this procedure, he seemed unaware of why he needed it and unsure of the purpose for it.  Advised him to return my call at his earliest convenience.   Nicholos Johns, PA-C

## 2017-08-12 ENCOUNTER — Ambulatory Visit: Payer: Medicare HMO | Admitting: Radiation Oncology

## 2017-08-12 DIAGNOSIS — C61 Malignant neoplasm of prostate: Secondary | ICD-10-CM | POA: Diagnosis not present

## 2017-08-12 DIAGNOSIS — R69 Illness, unspecified: Secondary | ICD-10-CM | POA: Diagnosis not present

## 2017-08-12 DIAGNOSIS — Z79818 Long term (current) use of other agents affecting estrogen receptors and estrogen levels: Secondary | ICD-10-CM | POA: Diagnosis not present

## 2017-08-12 DIAGNOSIS — Z791 Long term (current) use of non-steroidal anti-inflammatories (NSAID): Secondary | ICD-10-CM | POA: Diagnosis not present

## 2017-08-12 DIAGNOSIS — G8929 Other chronic pain: Secondary | ICD-10-CM | POA: Diagnosis not present

## 2017-08-26 DIAGNOSIS — C61 Malignant neoplasm of prostate: Secondary | ICD-10-CM | POA: Diagnosis not present

## 2017-08-26 DIAGNOSIS — C775 Secondary and unspecified malignant neoplasm of intrapelvic lymph nodes: Secondary | ICD-10-CM | POA: Diagnosis not present

## 2017-09-13 ENCOUNTER — Encounter: Payer: Self-pay | Admitting: Nurse Practitioner

## 2017-09-21 ENCOUNTER — Telehealth: Payer: Self-pay | Admitting: *Deleted

## 2017-09-21 NOTE — Telephone Encounter (Signed)
Called patient to inform of fid. marker and space oar placement for Aug. 5 @ Dr. Ralene Muskrat Office and his sim on August 8 @ Dr. Johny Shears Office, spoke with patient and he is aware of these appts.

## 2017-09-30 ENCOUNTER — Other Ambulatory Visit: Payer: Self-pay | Admitting: Urology

## 2017-09-30 DIAGNOSIS — C61 Malignant neoplasm of prostate: Secondary | ICD-10-CM

## 2017-10-04 DIAGNOSIS — C61 Malignant neoplasm of prostate: Secondary | ICD-10-CM | POA: Diagnosis not present

## 2017-10-07 ENCOUNTER — Ambulatory Visit
Admission: RE | Admit: 2017-10-07 | Discharge: 2017-10-07 | Disposition: A | Discharge status: Home / Self Care | Payer: Medicare HMO | Source: Ambulatory Visit | Attending: Radiation Oncology | Admitting: Radiation Oncology

## 2017-10-07 DIAGNOSIS — Z510 Encounter for antineoplastic radiation therapy: Secondary | ICD-10-CM | POA: Diagnosis not present

## 2017-10-07 DIAGNOSIS — C61 Malignant neoplasm of prostate: Secondary | ICD-10-CM | POA: Insufficient documentation

## 2017-10-07 NOTE — Progress Notes (Signed)
  Radiation Oncology         [phone]) [phone] ________________________________  Name: Aaron Adkins MRN: [id number]  Date: 10/07/2017  DOB: December 17, 1955  SIMULATION AND TREATMENT PLANNING NOTE    ICD-10-CM   1. Malignant neoplasm of prostate ([place]) C61     DIAGNOSIS:  62 y.o. gentleman with stage T1c adenocarcinoma of the prostate with a Gleason's score of 4+3 and a PSA of 113  NARRATIVE:  The patient was brought to the [facility].  Identity was confirmed.  All relevant records and images related to the planned course of therapy were reviewed.  The patient freely provided informed written consent to proceed with treatment after reviewing the details related to the planned course of therapy. The consent form was witnessed and verified by the simulation staff.  Then, the patient was set-up in a stable reproducible supine position for radiation therapy.  A vacuum lock pillow device was custom fabricated to position his legs in a reproducible immobilized position.  Then, I performed a urethrogram under sterile conditions to identify the prostatic apex.  CT images were obtained.  Surface markings were placed.  The CT images were loaded into the planning software.  Then the prostate target and avoidance structures including the rectum, bladder, bowel and hips were contoured.  Treatment planning then occurred.  The radiation prescription was entered and confirmed.  A total of one complex treatment devices were fabricated. I have requested : Intensity Modulated Radiotherapy (IMRT) is medically necessary for this case for the following reason:  Rectal sparing.Marland Kitchen  PLAN:  The patient will receive 45 Gy in 25 fractions of 1.8 Gy, followed by a boost to the prostate to a total dose of 75 Gy with 15 additional fractions of 2.0 Gy.  ________________________________  Sheral Apley Tammi Klippel, M.D.

## 2017-10-08 ENCOUNTER — Ambulatory Visit: Payer: Medicare HMO | Admitting: Internal Medicine

## 2017-10-08 ENCOUNTER — Ambulatory Visit: Payer: Medicare HMO

## 2017-10-18 ENCOUNTER — Ambulatory Visit: Payer: Medicare HMO

## 2017-10-19 ENCOUNTER — Ambulatory Visit: Payer: Medicare HMO

## 2017-10-19 ENCOUNTER — Telehealth: Payer: Self-pay | Admitting: Medical Oncology

## 2017-10-19 ENCOUNTER — Telehealth: Payer: Self-pay | Admitting: *Deleted

## 2017-10-19 NOTE — Telephone Encounter (Signed)
Called patient to inform of MRI for 10-22-17, lvm for a return call

## 2017-10-19 NOTE — Telephone Encounter (Signed)
Aaron Adkins called stating he had his fiducial markers and SpaceOAR placed 8/3. He has MRI 8/23 and begins treatment 8/26. He is concerned because it will be 3 weeks between [facility] and treatment due to approval of  MRI by insurance. I informed him that the [place] last about 3 months so he will be fine.

## 2017-10-20 ENCOUNTER — Ambulatory Visit: Payer: Medicare HMO

## 2017-10-21 ENCOUNTER — Ambulatory Visit: Payer: Medicare HMO

## 2017-10-22 ENCOUNTER — Ambulatory Visit: Payer: Medicare HMO

## 2017-10-22 ENCOUNTER — Ambulatory Visit (HOSPITAL_COMMUNITY)
Admission: RE | Admit: 2017-10-22 | Discharge: 2017-10-22 | Disposition: A | Discharge status: Home / Self Care | Payer: Medicare HMO | Source: Ambulatory Visit | Attending: Urology | Admitting: Urology

## 2017-10-22 DIAGNOSIS — C61 Malignant neoplasm of prostate: Secondary | ICD-10-CM

## 2017-10-25 ENCOUNTER — Ambulatory Visit: Payer: Medicare HMO

## 2017-10-25 DIAGNOSIS — Z510 Encounter for antineoplastic radiation therapy: Secondary | ICD-10-CM | POA: Diagnosis not present

## 2017-10-25 DIAGNOSIS — C61 Malignant neoplasm of prostate: Secondary | ICD-10-CM | POA: Diagnosis not present

## 2017-10-26 ENCOUNTER — Ambulatory Visit: Payer: Medicare HMO

## 2017-10-27 ENCOUNTER — Ambulatory Visit
Admission: RE | Admit: 2017-10-27 | Discharge: 2017-10-27 | Disposition: A | Discharge status: Home / Self Care | Payer: Medicare HMO | Source: Ambulatory Visit | Attending: Radiation Oncology | Admitting: Radiation Oncology

## 2017-10-27 ENCOUNTER — Ambulatory Visit: Payer: Medicare HMO

## 2017-10-27 DIAGNOSIS — C61 Malignant neoplasm of prostate: Secondary | ICD-10-CM | POA: Diagnosis not present

## 2017-10-27 DIAGNOSIS — Z510 Encounter for antineoplastic radiation therapy: Secondary | ICD-10-CM | POA: Diagnosis not present

## 2017-10-28 ENCOUNTER — Encounter: Payer: Self-pay | Admitting: Medical Oncology

## 2017-10-28 ENCOUNTER — Ambulatory Visit: Payer: Medicare HMO

## 2017-10-28 ENCOUNTER — Ambulatory Visit
Admission: RE | Admit: 2017-10-28 | Discharge: 2017-10-28 | Disposition: A | Discharge status: Home / Self Care | Payer: Medicare HMO | Source: Ambulatory Visit | Attending: Radiation Oncology | Admitting: Radiation Oncology

## 2017-10-28 DIAGNOSIS — Z510 Encounter for antineoplastic radiation therapy: Secondary | ICD-10-CM | POA: Diagnosis not present

## 2017-10-28 DIAGNOSIS — C61 Malignant neoplasm of prostate: Secondary | ICD-10-CM | POA: Diagnosis not present

## 2017-10-28 NOTE — Progress Notes (Signed)
FMLA successfully faxed to [employer] at [phone]. Mailed copy to patient address on file. 

## 2017-10-29 ENCOUNTER — Ambulatory Visit
Admission: RE | Admit: 2017-10-29 | Discharge: 2017-10-29 | Disposition: A | Discharge status: Home / Self Care | Payer: Medicare HMO | Source: Ambulatory Visit | Attending: Radiation Oncology | Admitting: Radiation Oncology

## 2017-10-29 ENCOUNTER — Ambulatory Visit: Payer: Medicare HMO

## 2017-10-29 DIAGNOSIS — Z510 Encounter for antineoplastic radiation therapy: Secondary | ICD-10-CM | POA: Diagnosis not present

## 2017-10-29 DIAGNOSIS — C61 Malignant neoplasm of prostate: Secondary | ICD-10-CM | POA: Diagnosis not present

## 2017-11-02 ENCOUNTER — Ambulatory Visit: Payer: Medicare HMO

## 2017-11-02 ENCOUNTER — Ambulatory Visit
Admission: RE | Admit: 2017-11-02 | Discharge: 2017-11-02 | Disposition: A | Discharge status: Home / Self Care | Payer: Medicare HMO | Source: Ambulatory Visit | Attending: Radiation Oncology | Admitting: Radiation Oncology

## 2017-11-02 DIAGNOSIS — Z510 Encounter for antineoplastic radiation therapy: Secondary | ICD-10-CM | POA: Insufficient documentation

## 2017-11-02 DIAGNOSIS — C61 Malignant neoplasm of prostate: Secondary | ICD-10-CM | POA: Insufficient documentation

## 2017-11-03 ENCOUNTER — Ambulatory Visit: Payer: Medicare HMO

## 2017-11-03 ENCOUNTER — Ambulatory Visit
Admission: RE | Admit: 2017-11-03 | Discharge: 2017-11-03 | Disposition: A | Discharge status: Home / Self Care | Payer: Medicare HMO | Source: Ambulatory Visit | Attending: Radiation Oncology | Admitting: Radiation Oncology

## 2017-11-03 DIAGNOSIS — C61 Malignant neoplasm of prostate: Secondary | ICD-10-CM | POA: Diagnosis not present

## 2017-11-03 DIAGNOSIS — Z510 Encounter for antineoplastic radiation therapy: Secondary | ICD-10-CM | POA: Diagnosis not present

## 2017-11-04 ENCOUNTER — Telehealth: Payer: Self-pay | Admitting: Radiation Oncology

## 2017-11-04 ENCOUNTER — Ambulatory Visit: Payer: Medicare HMO

## 2017-11-04 NOTE — Progress Notes (Signed)
Received voicemail from patient requesting a return call. Phoned patient back. Patient works as a Pharmacist, hospital. Patient reports his principal sent him home after Hep A was discovered in the school. Patient denies any GI symptoms or recently eating any foods from the school cafeteria. Assured patient it is safe to continue his prostate radiation. Encouraged patient to follow up with PCP if he is concerned about the Hep A. Patient verbalized understanding of all reviewed.

## 2017-11-05 ENCOUNTER — Ambulatory Visit
Admission: RE | Admit: 2017-11-05 | Discharge: 2017-11-05 | Disposition: A | Discharge status: Home / Self Care | Payer: Medicare HMO | Source: Ambulatory Visit | Attending: Radiation Oncology | Admitting: Radiation Oncology

## 2017-11-05 ENCOUNTER — Ambulatory Visit: Payer: Medicare HMO

## 2017-11-05 DIAGNOSIS — C61 Malignant neoplasm of prostate: Secondary | ICD-10-CM | POA: Diagnosis not present

## 2017-11-05 DIAGNOSIS — Z510 Encounter for antineoplastic radiation therapy: Secondary | ICD-10-CM | POA: Diagnosis not present

## 2017-11-08 ENCOUNTER — Ambulatory Visit: Payer: Medicare HMO

## 2017-11-08 ENCOUNTER — Ambulatory Visit
Admission: RE | Admit: 2017-11-08 | Discharge: 2017-11-08 | Disposition: A | Discharge status: Home / Self Care | Payer: Medicare HMO | Source: Ambulatory Visit | Attending: Radiation Oncology | Admitting: Radiation Oncology

## 2017-11-08 DIAGNOSIS — Z510 Encounter for antineoplastic radiation therapy: Secondary | ICD-10-CM | POA: Diagnosis not present

## 2017-11-08 DIAGNOSIS — C61 Malignant neoplasm of prostate: Secondary | ICD-10-CM | POA: Diagnosis not present

## 2017-11-09 ENCOUNTER — Ambulatory Visit: Payer: Medicare HMO

## 2017-11-09 ENCOUNTER — Ambulatory Visit
Admission: RE | Admit: 2017-11-09 | Discharge: 2017-11-09 | Disposition: A | Discharge status: Home / Self Care | Payer: Medicare HMO | Source: Ambulatory Visit | Attending: Radiation Oncology | Admitting: Radiation Oncology

## 2017-11-09 DIAGNOSIS — C61 Malignant neoplasm of prostate: Secondary | ICD-10-CM | POA: Diagnosis not present

## 2017-11-09 DIAGNOSIS — Z510 Encounter for antineoplastic radiation therapy: Secondary | ICD-10-CM | POA: Diagnosis not present

## 2017-11-10 ENCOUNTER — Ambulatory Visit: Payer: Medicare HMO

## 2017-11-10 ENCOUNTER — Ambulatory Visit
Admission: RE | Admit: 2017-11-10 | Discharge: 2017-11-10 | Disposition: A | Discharge status: Home / Self Care | Payer: Medicare HMO | Source: Ambulatory Visit | Attending: Radiation Oncology | Admitting: Radiation Oncology

## 2017-11-10 DIAGNOSIS — Z510 Encounter for antineoplastic radiation therapy: Secondary | ICD-10-CM | POA: Diagnosis not present

## 2017-11-10 DIAGNOSIS — C61 Malignant neoplasm of prostate: Secondary | ICD-10-CM | POA: Diagnosis not present

## 2017-11-11 ENCOUNTER — Ambulatory Visit
Admission: RE | Admit: 2017-11-11 | Discharge: 2017-11-11 | Disposition: A | Discharge status: Home / Self Care | Payer: Medicare HMO | Source: Ambulatory Visit | Attending: Radiation Oncology | Admitting: Radiation Oncology

## 2017-11-11 ENCOUNTER — Ambulatory Visit: Payer: Medicare HMO

## 2017-11-11 DIAGNOSIS — Z510 Encounter for antineoplastic radiation therapy: Secondary | ICD-10-CM | POA: Diagnosis not present

## 2017-11-11 DIAGNOSIS — C61 Malignant neoplasm of prostate: Secondary | ICD-10-CM | POA: Diagnosis not present

## 2017-11-12 ENCOUNTER — Ambulatory Visit
Admission: RE | Admit: 2017-11-12 | Discharge: 2017-11-12 | Disposition: A | Discharge status: Home / Self Care | Payer: Medicare HMO | Source: Ambulatory Visit | Attending: Radiation Oncology | Admitting: Radiation Oncology

## 2017-11-12 ENCOUNTER — Ambulatory Visit: Payer: Medicare HMO

## 2017-11-12 DIAGNOSIS — Z510 Encounter for antineoplastic radiation therapy: Secondary | ICD-10-CM | POA: Diagnosis not present

## 2017-11-12 DIAGNOSIS — C61 Malignant neoplasm of prostate: Secondary | ICD-10-CM | POA: Diagnosis not present

## 2017-11-15 ENCOUNTER — Ambulatory Visit: Payer: Medicare HMO

## 2017-11-15 ENCOUNTER — Ambulatory Visit
Admission: RE | Admit: 2017-11-15 | Discharge: 2017-11-15 | Disposition: A | Discharge status: Home / Self Care | Payer: Medicare HMO | Source: Ambulatory Visit | Attending: Radiation Oncology | Admitting: Radiation Oncology

## 2017-11-15 ENCOUNTER — Encounter: Payer: Self-pay | Admitting: Medical Oncology

## 2017-11-15 DIAGNOSIS — Z510 Encounter for antineoplastic radiation therapy: Secondary | ICD-10-CM | POA: Diagnosis not present

## 2017-11-15 DIAGNOSIS — C61 Malignant neoplasm of prostate: Secondary | ICD-10-CM | POA: Diagnosis not present

## 2017-11-16 ENCOUNTER — Ambulatory Visit: Payer: Medicare HMO

## 2017-11-16 ENCOUNTER — Ambulatory Visit
Admission: RE | Admit: 2017-11-16 | Discharge: 2017-11-16 | Disposition: A | Discharge status: Home / Self Care | Payer: Medicare HMO | Source: Ambulatory Visit | Attending: Radiation Oncology | Admitting: Radiation Oncology

## 2017-11-16 DIAGNOSIS — Z510 Encounter for antineoplastic radiation therapy: Secondary | ICD-10-CM | POA: Diagnosis not present

## 2017-11-16 DIAGNOSIS — C61 Malignant neoplasm of prostate: Secondary | ICD-10-CM | POA: Diagnosis not present

## 2017-11-17 ENCOUNTER — Ambulatory Visit: Payer: Medicare HMO

## 2017-11-17 ENCOUNTER — Ambulatory Visit
Admission: RE | Admit: 2017-11-17 | Discharge: 2017-11-17 | Disposition: A | Discharge status: Home / Self Care | Payer: Medicare HMO | Source: Ambulatory Visit | Attending: Radiation Oncology | Admitting: Radiation Oncology

## 2017-11-17 DIAGNOSIS — C61 Malignant neoplasm of prostate: Secondary | ICD-10-CM | POA: Diagnosis not present

## 2017-11-17 DIAGNOSIS — Z510 Encounter for antineoplastic radiation therapy: Secondary | ICD-10-CM | POA: Diagnosis not present

## 2017-11-18 ENCOUNTER — Ambulatory Visit
Admission: RE | Admit: 2017-11-18 | Discharge: 2017-11-18 | Disposition: A | Discharge status: Home / Self Care | Payer: Medicare HMO | Source: Ambulatory Visit | Attending: Radiation Oncology | Admitting: Radiation Oncology

## 2017-11-18 ENCOUNTER — Ambulatory Visit: Payer: Medicare HMO

## 2017-11-18 DIAGNOSIS — Z510 Encounter for antineoplastic radiation therapy: Secondary | ICD-10-CM | POA: Diagnosis not present

## 2017-11-18 DIAGNOSIS — C61 Malignant neoplasm of prostate: Secondary | ICD-10-CM | POA: Diagnosis not present

## 2017-11-19 ENCOUNTER — Ambulatory Visit
Admission: RE | Admit: 2017-11-19 | Discharge: 2017-11-19 | Disposition: A | Discharge status: Home / Self Care | Payer: Medicare HMO | Source: Ambulatory Visit | Attending: Radiation Oncology | Admitting: Radiation Oncology

## 2017-11-19 ENCOUNTER — Ambulatory Visit: Payer: Medicare HMO

## 2017-11-19 DIAGNOSIS — Z510 Encounter for antineoplastic radiation therapy: Secondary | ICD-10-CM | POA: Diagnosis not present

## 2017-11-19 DIAGNOSIS — C61 Malignant neoplasm of prostate: Secondary | ICD-10-CM | POA: Diagnosis not present

## 2017-11-22 ENCOUNTER — Ambulatory Visit: Payer: Medicare HMO

## 2017-11-22 ENCOUNTER — Ambulatory Visit
Admission: RE | Admit: 2017-11-22 | Discharge: 2017-11-22 | Disposition: A | Discharge status: Home / Self Care | Payer: Medicare HMO | Source: Ambulatory Visit | Attending: Radiation Oncology | Admitting: Radiation Oncology

## 2017-11-22 DIAGNOSIS — C61 Malignant neoplasm of prostate: Secondary | ICD-10-CM | POA: Diagnosis not present

## 2017-11-22 DIAGNOSIS — Z510 Encounter for antineoplastic radiation therapy: Secondary | ICD-10-CM | POA: Diagnosis not present

## 2017-11-23 ENCOUNTER — Ambulatory Visit
Admission: RE | Admit: 2017-11-23 | Discharge: 2017-11-23 | Disposition: A | Discharge status: Home / Self Care | Payer: Medicare HMO | Source: Ambulatory Visit | Attending: Radiation Oncology | Admitting: Radiation Oncology

## 2017-11-23 ENCOUNTER — Ambulatory Visit: Payer: Medicare HMO

## 2017-11-23 DIAGNOSIS — C61 Malignant neoplasm of prostate: Secondary | ICD-10-CM | POA: Diagnosis not present

## 2017-11-23 DIAGNOSIS — Z510 Encounter for antineoplastic radiation therapy: Secondary | ICD-10-CM | POA: Diagnosis not present

## 2017-11-24 ENCOUNTER — Encounter: Payer: Self-pay | Admitting: Radiation Oncology

## 2017-11-24 ENCOUNTER — Ambulatory Visit
Admission: RE | Admit: 2017-11-24 | Discharge: 2017-11-24 | Disposition: A | Discharge status: Home / Self Care | Payer: Medicare HMO | Source: Ambulatory Visit | Attending: Radiation Oncology | Admitting: Radiation Oncology

## 2017-11-24 ENCOUNTER — Ambulatory Visit: Payer: Medicare HMO

## 2017-11-24 DIAGNOSIS — C61 Malignant neoplasm of prostate: Secondary | ICD-10-CM | POA: Diagnosis not present

## 2017-11-24 DIAGNOSIS — Z510 Encounter for antineoplastic radiation therapy: Secondary | ICD-10-CM | POA: Diagnosis not present

## 2017-11-24 NOTE — Progress Notes (Signed)
Spoke with Aaron Adkins about [facility] grants, he would like to sign up and will bring his and his wife's income info tomorrow 9/26.

## 2017-11-25 ENCOUNTER — Ambulatory Visit: Payer: Medicare HMO

## 2017-11-25 ENCOUNTER — Ambulatory Visit
Admission: RE | Admit: 2017-11-25 | Discharge: 2017-11-25 | Disposition: A | Discharge status: Home / Self Care | Payer: Medicare HMO | Source: Ambulatory Visit | Attending: Radiation Oncology | Admitting: Radiation Oncology

## 2017-11-25 DIAGNOSIS — Z510 Encounter for antineoplastic radiation therapy: Secondary | ICD-10-CM | POA: Diagnosis not present

## 2017-11-25 DIAGNOSIS — C61 Malignant neoplasm of prostate: Secondary | ICD-10-CM | POA: Diagnosis not present

## 2017-11-26 ENCOUNTER — Ambulatory Visit: Payer: Medicare HMO

## 2017-11-26 ENCOUNTER — Ambulatory Visit
Admission: RE | Admit: 2017-11-26 | Discharge: 2017-11-26 | Disposition: A | Discharge status: Home / Self Care | Payer: Medicare HMO | Source: Ambulatory Visit | Attending: Radiation Oncology | Admitting: Radiation Oncology

## 2017-11-26 DIAGNOSIS — Z510 Encounter for antineoplastic radiation therapy: Secondary | ICD-10-CM | POA: Diagnosis not present

## 2017-11-26 DIAGNOSIS — C61 Malignant neoplasm of prostate: Secondary | ICD-10-CM | POA: Diagnosis not present

## 2017-11-29 ENCOUNTER — Ambulatory Visit
Admission: RE | Admit: 2017-11-29 | Discharge: 2017-11-29 | Disposition: A | Discharge status: Home / Self Care | Payer: Medicare HMO | Source: Ambulatory Visit | Attending: Radiation Oncology | Admitting: Radiation Oncology

## 2017-11-29 ENCOUNTER — Ambulatory Visit: Payer: Medicare HMO

## 2017-11-29 DIAGNOSIS — Z510 Encounter for antineoplastic radiation therapy: Secondary | ICD-10-CM | POA: Diagnosis not present

## 2017-11-29 DIAGNOSIS — C61 Malignant neoplasm of prostate: Secondary | ICD-10-CM | POA: Diagnosis not present

## 2017-11-30 ENCOUNTER — Ambulatory Visit: Payer: Medicare HMO

## 2017-11-30 ENCOUNTER — Ambulatory Visit
Admission: RE | Admit: 2017-11-30 | Discharge: 2017-11-30 | Disposition: A | Discharge status: Home / Self Care | Payer: Medicare HMO | Source: Ambulatory Visit | Attending: Radiation Oncology | Admitting: Radiation Oncology

## 2017-11-30 DIAGNOSIS — Z510 Encounter for antineoplastic radiation therapy: Secondary | ICD-10-CM | POA: Diagnosis not present

## 2017-11-30 DIAGNOSIS — C61 Malignant neoplasm of prostate: Secondary | ICD-10-CM | POA: Insufficient documentation

## 2017-12-01 ENCOUNTER — Ambulatory Visit: Admission: RE | Admit: 2017-12-01 | Payer: Medicare HMO | Source: Ambulatory Visit

## 2017-12-01 ENCOUNTER — Ambulatory Visit: Payer: Medicare HMO

## 2017-12-02 ENCOUNTER — Ambulatory Visit: Payer: Medicare HMO

## 2017-12-02 ENCOUNTER — Other Ambulatory Visit: Payer: Self-pay | Admitting: Nurse Practitioner

## 2017-12-02 ENCOUNTER — Ambulatory Visit
Admission: RE | Admit: 2017-12-02 | Discharge: 2017-12-02 | Disposition: A | Discharge status: Home / Self Care | Payer: Medicare HMO | Source: Ambulatory Visit | Attending: Radiation Oncology | Admitting: Radiation Oncology

## 2017-12-02 ENCOUNTER — Other Ambulatory Visit: Payer: Self-pay | Admitting: Radiation Oncology

## 2017-12-02 DIAGNOSIS — Z510 Encounter for antineoplastic radiation therapy: Secondary | ICD-10-CM | POA: Diagnosis not present

## 2017-12-02 DIAGNOSIS — N3041 Irradiation cystitis with hematuria: Secondary | ICD-10-CM

## 2017-12-02 DIAGNOSIS — R300 Dysuria: Secondary | ICD-10-CM

## 2017-12-02 DIAGNOSIS — C61 Malignant neoplasm of prostate: Secondary | ICD-10-CM | POA: Diagnosis not present

## 2017-12-02 LAB — URINALYSIS, COMPLETE (UACMP) WITH MICROSCOPIC
BILIRUBIN URINE: NEGATIVE
Bacteria, UA: NONE SEEN
Glucose, UA: NEGATIVE mg/dL
KETONES UR: NEGATIVE mg/dL
LEUKOCYTES UA: NEGATIVE
NITRITE: NEGATIVE
Protein, ur: NEGATIVE mg/dL
SPECIFIC GRAVITY, URINE: 1.018 (ref 1.005–1.030)
pH: 5 (ref 5.0–8.0)

## 2017-12-02 MED ORDER — TAMSULOSIN HCL 0.4 MG PO CAPS
0.4000 mg | ORAL_CAPSULE | Freq: Every day | ORAL | 5 refills | Status: DC
Start: 2017-12-02 — End: 2018-10-13

## 2017-12-03 ENCOUNTER — Ambulatory Visit
Admission: RE | Admit: 2017-12-03 | Discharge: 2017-12-03 | Disposition: A | Discharge status: Home / Self Care | Payer: Medicare HMO | Source: Ambulatory Visit | Attending: Radiation Oncology | Admitting: Radiation Oncology

## 2017-12-03 ENCOUNTER — Ambulatory Visit: Payer: Medicare HMO

## 2017-12-03 DIAGNOSIS — Z510 Encounter for antineoplastic radiation therapy: Secondary | ICD-10-CM | POA: Diagnosis not present

## 2017-12-03 DIAGNOSIS — C61 Malignant neoplasm of prostate: Secondary | ICD-10-CM | POA: Diagnosis not present

## 2017-12-03 LAB — URINE CULTURE: Culture: NO GROWTH

## 2017-12-06 ENCOUNTER — Ambulatory Visit: Payer: Medicare HMO

## 2017-12-06 ENCOUNTER — Ambulatory Visit
Admission: RE | Admit: 2017-12-06 | Discharge: 2017-12-06 | Disposition: A | Discharge status: Home / Self Care | Payer: Medicare HMO | Source: Ambulatory Visit | Attending: Radiation Oncology | Admitting: Radiation Oncology

## 2017-12-06 ENCOUNTER — Telehealth: Payer: Self-pay | Admitting: Radiation Oncology

## 2017-12-06 DIAGNOSIS — Z510 Encounter for antineoplastic radiation therapy: Secondary | ICD-10-CM | POA: Diagnosis not present

## 2017-12-06 DIAGNOSIS — C61 Malignant neoplasm of prostate: Secondary | ICD-10-CM | POA: Diagnosis not present

## 2017-12-06 NOTE — Progress Notes (Signed)
Please call patient with normal result.  Thanks. MM 

## 2017-12-06 NOTE — Progress Notes (Signed)
Phoned patient. No answer. Left voicemail message explaining that his urinalysis did show a small amount of blood but no bacteria was found on the culture. Encouraged patient to increase water intake. Explained irritation steams from radiation effects and not an infection. Provided my contact number should patient have questions about voicemail message.

## 2017-12-07 ENCOUNTER — Ambulatory Visit
Admission: RE | Admit: 2017-12-07 | Discharge: 2017-12-07 | Disposition: A | Discharge status: Home / Self Care | Payer: Medicare HMO | Source: Ambulatory Visit | Attending: Radiation Oncology | Admitting: Radiation Oncology

## 2017-12-07 ENCOUNTER — Ambulatory Visit: Payer: Medicare HMO

## 2017-12-07 DIAGNOSIS — C61 Malignant neoplasm of prostate: Secondary | ICD-10-CM | POA: Diagnosis not present

## 2017-12-07 DIAGNOSIS — Z510 Encounter for antineoplastic radiation therapy: Secondary | ICD-10-CM | POA: Diagnosis not present

## 2017-12-08 ENCOUNTER — Ambulatory Visit
Admission: RE | Admit: 2017-12-08 | Discharge: 2017-12-08 | Disposition: A | Discharge status: Home / Self Care | Payer: Medicare HMO | Source: Ambulatory Visit | Attending: Radiation Oncology | Admitting: Radiation Oncology

## 2017-12-08 ENCOUNTER — Ambulatory Visit: Payer: Medicare HMO

## 2017-12-08 DIAGNOSIS — Z510 Encounter for antineoplastic radiation therapy: Secondary | ICD-10-CM | POA: Diagnosis not present

## 2017-12-08 DIAGNOSIS — C61 Malignant neoplasm of prostate: Secondary | ICD-10-CM | POA: Diagnosis not present

## 2017-12-09 ENCOUNTER — Ambulatory Visit: Payer: Medicare HMO

## 2017-12-09 ENCOUNTER — Ambulatory Visit
Admission: RE | Admit: 2017-12-09 | Discharge: 2017-12-09 | Disposition: A | Discharge status: Home / Self Care | Payer: Medicare HMO | Source: Ambulatory Visit | Attending: Radiation Oncology | Admitting: Radiation Oncology

## 2017-12-09 DIAGNOSIS — Z510 Encounter for antineoplastic radiation therapy: Secondary | ICD-10-CM | POA: Diagnosis not present

## 2017-12-09 DIAGNOSIS — C61 Malignant neoplasm of prostate: Secondary | ICD-10-CM | POA: Diagnosis not present

## 2017-12-10 ENCOUNTER — Ambulatory Visit
Admission: RE | Admit: 2017-12-10 | Discharge: 2017-12-10 | Disposition: A | Discharge status: Home / Self Care | Payer: Medicare HMO | Source: Ambulatory Visit | Attending: Radiation Oncology | Admitting: Radiation Oncology

## 2017-12-10 ENCOUNTER — Ambulatory Visit: Payer: Medicare HMO

## 2017-12-10 DIAGNOSIS — C61 Malignant neoplasm of prostate: Secondary | ICD-10-CM | POA: Diagnosis not present

## 2017-12-10 DIAGNOSIS — Z510 Encounter for antineoplastic radiation therapy: Secondary | ICD-10-CM | POA: Diagnosis not present

## 2017-12-13 ENCOUNTER — Ambulatory Visit: Payer: Medicare HMO

## 2017-12-13 ENCOUNTER — Ambulatory Visit
Admission: RE | Admit: 2017-12-13 | Discharge: 2017-12-13 | Disposition: A | Discharge status: Home / Self Care | Payer: Medicare HMO | Source: Ambulatory Visit | Attending: Radiation Oncology | Admitting: Radiation Oncology

## 2017-12-13 DIAGNOSIS — Z510 Encounter for antineoplastic radiation therapy: Secondary | ICD-10-CM | POA: Diagnosis not present

## 2017-12-13 DIAGNOSIS — C61 Malignant neoplasm of prostate: Secondary | ICD-10-CM | POA: Diagnosis not present

## 2017-12-14 ENCOUNTER — Ambulatory Visit
Admission: RE | Admit: 2017-12-14 | Discharge: 2017-12-14 | Disposition: A | Discharge status: Home / Self Care | Payer: Medicare HMO | Source: Ambulatory Visit | Attending: Radiation Oncology | Admitting: Radiation Oncology

## 2017-12-14 ENCOUNTER — Ambulatory Visit: Payer: Medicare HMO

## 2017-12-14 DIAGNOSIS — Z510 Encounter for antineoplastic radiation therapy: Secondary | ICD-10-CM | POA: Diagnosis not present

## 2017-12-14 DIAGNOSIS — C61 Malignant neoplasm of prostate: Secondary | ICD-10-CM | POA: Diagnosis not present

## 2017-12-15 ENCOUNTER — Ambulatory Visit
Admission: RE | Admit: 2017-12-15 | Discharge: 2017-12-15 | Disposition: A | Discharge status: Home / Self Care | Payer: Medicare HMO | Source: Ambulatory Visit | Attending: Radiation Oncology | Admitting: Radiation Oncology

## 2017-12-15 DIAGNOSIS — Z510 Encounter for antineoplastic radiation therapy: Secondary | ICD-10-CM | POA: Diagnosis not present

## 2017-12-15 DIAGNOSIS — C61 Malignant neoplasm of prostate: Secondary | ICD-10-CM | POA: Diagnosis not present

## 2017-12-16 ENCOUNTER — Ambulatory Visit
Admission: RE | Admit: 2017-12-16 | Discharge: 2017-12-16 | Disposition: A | Discharge status: Home / Self Care | Payer: Medicare HMO | Source: Ambulatory Visit | Attending: Radiation Oncology | Admitting: Radiation Oncology

## 2017-12-16 DIAGNOSIS — C61 Malignant neoplasm of prostate: Secondary | ICD-10-CM | POA: Diagnosis not present

## 2017-12-16 DIAGNOSIS — Z510 Encounter for antineoplastic radiation therapy: Secondary | ICD-10-CM | POA: Diagnosis not present

## 2017-12-17 ENCOUNTER — Ambulatory Visit: Admission: RE | Admit: 2017-12-17 | Payer: Medicare HMO | Source: Ambulatory Visit

## 2017-12-17 ENCOUNTER — Encounter: Payer: Self-pay | Admitting: Radiation Oncology

## 2017-12-20 ENCOUNTER — Ambulatory Visit: Payer: Medicare HMO

## 2017-12-20 ENCOUNTER — Ambulatory Visit
Admission: RE | Admit: 2017-12-20 | Discharge: 2017-12-20 | Disposition: A | Discharge status: Home / Self Care | Payer: Medicare HMO | Source: Ambulatory Visit | Attending: Radiation Oncology | Admitting: Radiation Oncology

## 2017-12-20 DIAGNOSIS — C61 Malignant neoplasm of prostate: Secondary | ICD-10-CM | POA: Diagnosis not present

## 2017-12-20 DIAGNOSIS — Z510 Encounter for antineoplastic radiation therapy: Secondary | ICD-10-CM | POA: Diagnosis not present

## 2017-12-21 ENCOUNTER — Ambulatory Visit: Payer: Medicare HMO

## 2017-12-21 ENCOUNTER — Ambulatory Visit
Admission: RE | Admit: 2017-12-21 | Discharge: 2017-12-21 | Disposition: A | Discharge status: Home / Self Care | Payer: Medicare HMO | Source: Ambulatory Visit | Attending: Radiation Oncology | Admitting: Radiation Oncology

## 2017-12-21 DIAGNOSIS — Z510 Encounter for antineoplastic radiation therapy: Secondary | ICD-10-CM | POA: Diagnosis not present

## 2017-12-21 DIAGNOSIS — C61 Malignant neoplasm of prostate: Secondary | ICD-10-CM | POA: Diagnosis not present

## 2017-12-22 ENCOUNTER — Ambulatory Visit
Admission: RE | Admit: 2017-12-22 | Discharge: 2017-12-22 | Disposition: A | Discharge status: Home / Self Care | Payer: Medicare HMO | Source: Ambulatory Visit | Attending: Radiation Oncology | Admitting: Radiation Oncology

## 2017-12-22 ENCOUNTER — Ambulatory Visit: Payer: Medicare HMO

## 2017-12-22 DIAGNOSIS — C61 Malignant neoplasm of prostate: Secondary | ICD-10-CM | POA: Diagnosis not present

## 2017-12-22 DIAGNOSIS — Z510 Encounter for antineoplastic radiation therapy: Secondary | ICD-10-CM | POA: Diagnosis not present

## 2017-12-23 ENCOUNTER — Ambulatory Visit
Admission: RE | Admit: 2017-12-23 | Discharge: 2017-12-23 | Disposition: A | Discharge status: Home / Self Care | Payer: Medicare HMO | Source: Ambulatory Visit | Attending: Radiation Oncology | Admitting: Radiation Oncology

## 2017-12-23 ENCOUNTER — Ambulatory Visit: Payer: Medicare HMO

## 2017-12-23 DIAGNOSIS — C61 Malignant neoplasm of prostate: Secondary | ICD-10-CM | POA: Diagnosis not present

## 2017-12-23 DIAGNOSIS — Z510 Encounter for antineoplastic radiation therapy: Secondary | ICD-10-CM | POA: Diagnosis not present

## 2017-12-24 ENCOUNTER — Ambulatory Visit: Payer: Medicare HMO

## 2017-12-24 ENCOUNTER — Ambulatory Visit
Admission: RE | Admit: 2017-12-24 | Discharge: 2017-12-24 | Disposition: A | Discharge status: Home / Self Care | Payer: Medicare HMO | Source: Ambulatory Visit | Attending: Radiation Oncology | Admitting: Radiation Oncology

## 2017-12-24 DIAGNOSIS — C61 Malignant neoplasm of prostate: Secondary | ICD-10-CM | POA: Diagnosis not present

## 2017-12-24 DIAGNOSIS — Z510 Encounter for antineoplastic radiation therapy: Secondary | ICD-10-CM | POA: Diagnosis not present

## 2017-12-27 ENCOUNTER — Ambulatory Visit
Admission: RE | Admit: 2017-12-27 | Discharge: 2017-12-27 | Disposition: A | Discharge status: Home / Self Care | Payer: Medicare HMO | Source: Ambulatory Visit | Attending: Radiation Oncology | Admitting: Radiation Oncology

## 2017-12-27 ENCOUNTER — Encounter: Payer: Self-pay | Admitting: Radiation Oncology

## 2017-12-27 DIAGNOSIS — Z510 Encounter for antineoplastic radiation therapy: Secondary | ICD-10-CM | POA: Diagnosis not present

## 2017-12-27 DIAGNOSIS — C61 Malignant neoplasm of prostate: Secondary | ICD-10-CM | POA: Diagnosis not present

## 2017-12-29 NOTE — Progress Notes (Signed)
  Radiation Oncology         [phone]) [phone] ________________________________  Name: Aaron Adkins MRN: [id number]  Date: 12/27/2017  DOB: [birth year]  End of Treatment Note  Diagnosis:   62 y.o. gentleman with stage T1c adenocarcinoma of the prostate with a Gleason's score of 4+3 and a PSA of 113     Indication for treatment:  Curative, Definitive Radiotherapy       Radiation treatment dates:   10/27/2017 to 12/27/2017  Site/dose:    1. The prostate was treated to 45 Gy in 25 fractions of 1.8 Gy. 2. The prostate was boosted to 30 Gy in 15 fractions of 2 Gy.  Beams/energy:   The patient was treated with IMRT using volumetric arc therapy delivering 6 MV X-rays to clockwise and counterclockwise circumferential arcs with a 90 degree collimator offset to avoid dose scalloping.  Image guidance was performed with daily cone beam CT prior to each fraction to align to gold markers in the prostate and assure proper bladder and rectal fill volumes.  Immobilization was achieved with BodyFix custom mold.  Narrative: The patient tolerated radiation treatment relatively well.   He experienced modest fatigue and some minor urinary irritation including occasional urgency, leakage post-void, not completely emptying his bladder, and nocturia x3-4. He denied dysuria, hematuria, hesitancy or straining. His Flomax dose was increased to BID towards the end of treatment and he reported imoproved urine stream. He reported bowel movements remained regular.   Plan: The patient has completed radiation treatment. He will return to radiation oncology clinic for routine followup in one month. I advised him to call or return sooner if he has any questions or concerns related to his recovery or treatment. ________________________________  Sheral Apley. Tammi Klippel, M.D.  This document serves as a record of services personally performed by Tyler Pita, MD. It was created on his behalf by Arlyce Harman, a trained medical  scribe. The creation of this record is based on the scribe's personal observations and the provider's statements to them. This document has been checked and approved by the attending provider.

## 2018-01-04 DIAGNOSIS — C61 Malignant neoplasm of prostate: Secondary | ICD-10-CM | POA: Diagnosis not present

## 2018-01-11 ENCOUNTER — Ambulatory Visit: Payer: Medicare HMO

## 2018-01-12 DIAGNOSIS — R972 Elevated prostate specific antigen [PSA]: Secondary | ICD-10-CM | POA: Diagnosis not present

## 2018-01-12 DIAGNOSIS — C61 Malignant neoplasm of prostate: Secondary | ICD-10-CM | POA: Diagnosis not present

## 2018-02-01 NOTE — Progress Notes (Signed)
Radiation Oncology         [phone]) [phone] ________________________________  Name: Aaron Adkins. MRN: [id number]  Date: 02/02/2018  DOB: 09-12-1955  Post Treatment Note  CC: [name], NP  Raynelle Bring, MD  Diagnosis:   62 y.o. gentleman with stage T1c adenocarcinoma of the prostate with a Gleason's score of 4+3 and a PSA of 113     Interval Since Last Radiation:  5 weeks  10/27/2017 to 12/27/2017:   1. The prostate was treated to 45 Gy in 25 fractions of 1.8 Gy. 2. The prostate was boosted to 30 Gy in 15 fractions of 2 Gy.  Narrative:  The patient returns today for routine follow-up. He tolerated radiation treatment relatively well.   He experienced modest fatigue and some minor urinary irritation including occasional urgency, leakage post-void, not completely emptying his bladder, and nocturia x3-4. He denied dysuria, hematuria, hesitancy or straining. His Flomax dose was increased to BID towards the end of treatment and he reported imoproved urine stream. He reported bowel movements remained regular.   He continued on Lupron for ADT throughout his treatment course.                            On review of systems, the patient states that he is doing very well overall.  He has continued to tolerate his ADT fairly well and is scheduled for his next 56-month injection on 02/10/2018.  He does continue to experience intermittent hot flashes, fatigue and decreased libido but reports that these are tolerable.  He has noticed gradual improvement in his lower urinary tract symptoms with a current IPSS score of 18, indicating moderate urinary symptoms.  He continues to take Flomax twice daily as prescribed which does help to improve his flow of stream and ability to empty his bladder.  He continues with increased frequency and urgency as well as nocturia x3 per night but denies dysuria, gross hematuria or incontinence.  In general, he feels that he empties his bladder well with voiding.  He  denies abdominal pain, nausea, vomiting, diarrhea or constipation.  He reports a healthy appetite and is maintaining his weight.  ALLERGIES:  has No Known Allergies.  Meds: Current Outpatient Medications  Medication Sig Dispense Refill  . AMBULATORY NON FORMULARY MEDICATION Prosvent: take 1 tablet by mouth daily for prostate health.    . Ascorbic Acid (VITAMIN C) 1000 MG tablet Take 1,000 mg by mouth daily. Takes two tablets per day    . diclofenac (VOLTAREN) 75 MG EC tablet TAKE 1 TABLET BY MOUTH ONCE DAILY 90 tablet 1  . Multiple Vitamins-Minerals (CENTRUM PO) Take 1 tablet by mouth daily.    . tamsulosin (FLOMAX) 0.4 MG CAPS capsule Take 1 capsule (0.4 mg total) by mouth daily after supper. 30 capsule 5   No current facility-administered medications for this encounter.     Physical Findings:  height is 6\' 2"  (1.88 m) and weight is 228 lb 9.6 oz (103.7 kg). His temperature is 98.7 F (37.1 C). His blood pressure is 149/99 (abnormal) and his pulse is 72. His respiration is 18 and oxygen saturation is 99%.  Pain Assessment Pain Score: 0-No pain/10 In general this is a well appearing African-American male in no acute distress.  He's alert and oriented x4 and appropriate throughout the examination. Cardiopulmonary assessment is negative for acute distress and he exhibits normal effort.   Lab Findings: Lab Results  Component Value Date  WBC 7.1 03/10/2017   HGB 13.7 03/10/2017   HCT 39.6 03/10/2017   MCV 92.5 03/10/2017   PLT 308 03/10/2017     Radiographic Findings: No results found.  Impression/Plan: 1. 62 y.o. gentleman with stage T1c adenocarcinoma of the prostate with a Gleason's score of 4+3 and a PSA of 113. He will continue to follow up with urology for ongoing PSA determinations and has an appointment scheduled with Dr. Jeffie Pollock on 02/10/2018 for his next Lupron injection.  He anticipates completing an 18 to 79-month course of ADT.  He understands what to expect with  regards to PSA monitoring going forward. I will look forward to following his response to treatment via correspondence with urology, and would be happy to continue to participate in his care if clinically indicated. I talked to the patient about what to expect in the future, including his risk for erectile dysfunction and rectal bleeding. I encouraged him to call or return to the office if he has any questions regarding his previous radiation or possible radiation side effects. He was comfortable with this plan and will follow up as needed.       Nicholos Johns, PA-C

## 2018-02-02 ENCOUNTER — Other Ambulatory Visit: Payer: Self-pay

## 2018-02-02 ENCOUNTER — Encounter: Payer: Self-pay | Admitting: Urology

## 2018-02-02 ENCOUNTER — Ambulatory Visit
Admission: RE | Admit: 2018-02-02 | Discharge: 2018-02-02 | Disposition: A | Discharge status: Home / Self Care | Payer: Medicare HMO | Source: Ambulatory Visit | Attending: Urology | Admitting: Urology

## 2018-02-02 VITALS — BP 149/99 | HR 72 | Temp 98.7°F | Resp 18 | Ht 74.0 in | Wt 228.6 lb

## 2018-02-02 DIAGNOSIS — Z923 Personal history of irradiation: Secondary | ICD-10-CM | POA: Diagnosis not present

## 2018-02-02 DIAGNOSIS — C61 Malignant neoplasm of prostate: Secondary | ICD-10-CM | POA: Diagnosis not present

## 2018-02-02 NOTE — Progress Notes (Signed)
Denies dysuria or hematuria. Reports post void dribble continues. Reports taking Flomax bid. IPSS 18. Scheduled to receive next Lupron injection on 02/10/2018.   BP (!) 149/99   Pulse 72   Temp 98.7 F (37.1 C)   Resp 18   Ht 6\' 2"  (1.88 m)   Wt 228 lb 9.6 oz (103.7 kg)   SpO2 99%   BMI 29.35 kg/m  Wt Readings from Last 3 Encounters:  02/02/18 228 lb 9.6 oz (103.7 kg)  05/25/17 211 lb 6.4 oz (95.9 kg)  05/07/17 214 lb 6.4 oz (97.3 kg)

## 2018-02-17 DIAGNOSIS — Z5111 Encounter for antineoplastic chemotherapy: Secondary | ICD-10-CM | POA: Diagnosis not present

## 2018-02-17 DIAGNOSIS — C61 Malignant neoplasm of prostate: Secondary | ICD-10-CM | POA: Diagnosis not present

## 2018-03-09 ENCOUNTER — Ambulatory Visit: Payer: Medicare HMO | Admitting: Nurse Practitioner

## 2018-03-24 ENCOUNTER — Ambulatory Visit (INDEPENDENT_AMBULATORY_CARE_PROVIDER_SITE_OTHER): Payer: Medicare HMO | Admitting: Nurse Practitioner

## 2018-03-24 ENCOUNTER — Encounter: Payer: Self-pay | Admitting: Nurse Practitioner

## 2018-03-24 VITALS — BP 118/76 | HR 71 | Temp 98.3°F | Ht 75.0 in | Wt 234.0 lb

## 2018-03-24 DIAGNOSIS — G8929 Other chronic pain: Secondary | ICD-10-CM | POA: Diagnosis not present

## 2018-03-24 DIAGNOSIS — R030 Elevated blood-pressure reading, without diagnosis of hypertension: Secondary | ICD-10-CM

## 2018-03-24 DIAGNOSIS — M25571 Pain in right ankle and joints of right foot: Secondary | ICD-10-CM | POA: Diagnosis not present

## 2018-03-24 DIAGNOSIS — M1990 Unspecified osteoarthritis, unspecified site: Secondary | ICD-10-CM | POA: Diagnosis not present

## 2018-03-24 DIAGNOSIS — E782 Mixed hyperlipidemia: Secondary | ICD-10-CM

## 2018-03-24 LAB — COMPLETE METABOLIC PANEL WITH GFR
AG RATIO: 1.1 (calc) (ref 1.0–2.5)
ALT: 36 U/L (ref 9–46)
AST: 32 U/L (ref 10–35)
Albumin: 3.9 g/dL (ref 3.6–5.1)
Alkaline phosphatase (APISO): 82 U/L (ref 40–115)
BILIRUBIN TOTAL: 0.7 mg/dL (ref 0.2–1.2)
BUN: 9 mg/dL (ref 7–25)
CO2: 26 mmol/L (ref 20–32)
Calcium: 9.8 mg/dL (ref 8.6–10.3)
Chloride: 106 mmol/L (ref 98–110)
Creat: 0.8 mg/dL (ref 0.70–1.25)
GFR, EST AFRICAN AMERICAN: 111 mL/min/{1.73_m2} (ref >=60)
GFR, Est Non African American: 96 mL/min/{1.73_m2} (ref >=60)
Globulin: 3.5 g/dL (calc) (ref 1.9–3.7)
Glucose, Bld: 87 mg/dL (ref 65–99)
Potassium: 4.4 mmol/L (ref 3.5–5.3)
Sodium: 140 mmol/L (ref 135–146)
TOTAL PROTEIN: 7.4 g/dL (ref 6.1–8.1)

## 2018-03-24 LAB — LIPID PANEL
CHOL/HDL RATIO: 3.9 (calc) (ref <5.0)
Cholesterol: 192 mg/dL (ref <200)
HDL: 49 mg/dL (ref >40)
LDL CHOLESTEROL (CALC): 121 mg/dL — AB
NON-HDL CHOLESTEROL (CALC): 143 mg/dL — AB (ref <130)
TRIGLYCERIDES: 109 mg/dL (ref <150)

## 2018-03-24 LAB — CBC WITH DIFFERENTIAL/PLATELET
ABSOLUTE MONOCYTES: 442 {cells}/uL (ref 200–950)
BASOS PCT: 0.8 %
Basophils Absolute: 42 cells/uL (ref 0–200)
EOS PCT: 3.7 %
Eosinophils Absolute: 192 cells/uL (ref 15–500)
HEMATOCRIT: 39.7 % (ref 38.5–50.0)
HEMOGLOBIN: 13.9 g/dL (ref 13.2–17.1)
LYMPHS ABS: 1581 {cells}/uL (ref 850–3900)
MCH: 33.3 pg — ABNORMAL HIGH (ref 27.0–33.0)
MCHC: 35 g/dL (ref 32.0–36.0)
MCV: 95 fL (ref 80.0–100.0)
MPV: 10.8 fL (ref 7.5–12.5)
Monocytes Relative: 8.5 %
NEUTROS ABS: 2943 {cells}/uL (ref 1500–7800)
Neutrophils Relative %: 56.6 %
Platelets: 292 10*3/uL (ref 140–400)
RBC: 4.18 10*6/uL — AB (ref 4.20–5.80)
RDW: 12.2 % (ref 11.0–15.0)
Total Lymphocyte: 30.4 %
WBC: 5.2 10*3/uL (ref 3.8–10.8)

## 2018-03-24 NOTE — Patient Instructions (Addendum)
6 months for AWV and physical, sooner if needed

## 2018-03-24 NOTE — Progress Notes (Signed)
Careteam: Patient Care Team: [name], NP as PCP - General (Geriatric Medicine)  Advanced Directive information    No Known Allergies  Chief Complaint  Patient presents with  . Medical Management of Chronic Issues    10 month follow-up, right foot pain when standing (feels like foot drop)   . Health Maintenance    Refused colonoscopy   . Immunizations    Refused all immunization updates      HPI: Patient is a 63 y.o. male seen in the office today for right foot pain.  He has not been seen since since 05/07/2017 and missed follow up. Pts blood pressure has improved since last OV.   Pain to left foot when he stands on it for a long period of time. Feels like foot turns in after he is on it for a long time.  No pain currently. No trouble walking. No injury.  When he is standing for a long period there is pain to the top of his foot.  Has been going on for over 6 months.  Pain 6-7/10. Takes aleve which helps the pain.  Has changed shoes which has improved significantly.    Prostate cancer- following with urology, had radiation and getting Lupron injections. PSA improved from 113 down to 4  Changing diet and eating better.  Very active at work. Part-time custodian.   Review of Systems:  Review of Systems  Constitutional: Negative for chills, fever and weight loss.  HENT: Negative for tinnitus.   Respiratory: Negative for cough, sputum production and shortness of breath.   Cardiovascular: Negative for chest pain, palpitations and leg swelling.  Gastrointestinal: Negative for abdominal pain, constipation, diarrhea and heartburn.  Genitourinary: Negative for dysuria, frequency and urgency.       Gets up once at night to urinate, prostate cancer  Musculoskeletal: Positive for joint pain. Negative for back pain, falls and myalgias.       Arthritis in knees (rare) more common in shoulders, arms and ankles. Worse in the morning but better as the day progresses  Skin:  Negative.   Neurological: Negative for dizziness and headaches.  Psychiatric/Behavioral: Negative for depression and memory loss. The patient does not have insomnia.     Past Medical History:  Diagnosis Date  . Arthritis   . Chest pain 08/28/2016   had pleurisy  . Dyslipidemia   . Electrocardiogram suggestive of ST elevation myocardial infarction (STEMI) ([place]) 08/28/2016  . Elevated PSA    03/11/16 PSA of 98.0  . Essential hypertension   . Fatigue   . Hematuria    due to prostate  . Other visual disturbances   . Overactive bladder   . Pericarditis    a. diagnosed in 07/2016 - cath showed no angiographic evidence of CAD.   Marland Kitchen Pneumothorax 1986  . Prostate cancer ([place])   . Tobacco abuse   . Urinary urgency   . Vitamin D deficiency    Past Surgical History:  Procedure Laterality Date  . DENTAL SURGERY     all teeth removed/ has complete denture  . LEFT HEART CATH AND CORONARY ANGIOGRAPHY N/A 08/28/2016   Procedure: Left Heart Cath and Coronary Angiography;  Surgeon: Burnell Blanks, MD;  Location: [facility];  Service: Cardiovascular;  Laterality: N/A;  . PROSTATE BIOPSY    . WISDOM TOOTH EXTRACTION     Social History:   reports that he has been smoking cigarettes. He has a 8.75 pack-year smoking history. He has  never used smokeless tobacco. He reports current alcohol use. He reports current drug use. Frequency: 2.00 times per week. Drug: Marijuana.  Family History  Problem Relation Age of Onset  . Hypertension Mother   . Arthritis Mother   . Stroke Father   . Heart disease Father   . Diabetes Sister   . Cancer Maternal Grandfather        prostate     Medications: Patient's Medications  New Prescriptions   No medications on file  Previous Medications   AMBULATORY NON FORMULARY MEDICATION    Prosvent: take 1 tablet by mouth daily for prostate health.   ASCORBIC ACID (VITAMIN C) 1000 MG TABLET    Take 1,000 mg by mouth daily. Takes two tablets per day     DICLOFENAC (VOLTAREN) 75 MG EC TABLET    TAKE 1 TABLET BY MOUTH ONCE DAILY   MULTIPLE VITAMINS-MINERALS (CENTRUM PO)    Take 1 tablet by mouth daily.   TAMSULOSIN (FLOMAX) 0.4 MG CAPS CAPSULE    Take 1 capsule (0.4 mg total) by mouth daily after supper.  Modified Medications   No medications on file  Discontinued Medications   No medications on file     Physical Exam:  Vitals:   03/24/18 0832  BP: 118/76  Pulse: 71  Temp: 98.3 F (36.8 C)  TempSrc: Oral  SpO2: 96%  Weight: 234 lb (106.1 kg)  Height: 6\' 3"  (1.905 m)   Body mass index is 29.25 kg/m.  Physical Exam Constitutional:      General: He is not in acute distress.    Appearance: He is well-developed. He is not diaphoretic.  HENT:     Head: Normocephalic and atraumatic.     Mouth/Throat:     Pharynx: No oropharyngeal exudate.  Eyes:     Conjunctiva/sclera: Conjunctivae normal.     Pupils: Pupils are equal, round, and reactive to light.  Neck:     Musculoskeletal: Normal range of motion and neck supple.  Cardiovascular:     Rate and Rhythm: Normal rate and regular rhythm.     Heart sounds: Normal heart sounds.  Pulmonary:     Effort: Pulmonary effort is normal. No respiratory distress.     Breath sounds: Normal breath sounds. No wheezing or rales.  Abdominal:     General: Bowel sounds are normal. There is no distension.     Palpations: Abdomen is soft.     Tenderness: There is no abdominal tenderness.  Musculoskeletal: Normal range of motion.        General: No tenderness.  Skin:    General: Skin is warm and dry.  Neurological:     Mental Status: He is alert and oriented to person, place, and time.     Cranial Nerves: No cranial nerve deficit.     Coordination: Coordination normal.     Labs reviewed: Basic Metabolic Panel: No results for input(s): NA, K, CL, CO2, GLUCOSE, BUN, CREATININE, CALCIUM, MG, PHOS, TSH in the last 8760 hours. Liver Function Tests: No results for input(s): AST, ALT,  ALKPHOS, BILITOT, PROT, ALBUMIN in the last 8760 hours. No results for input(s): LIPASE, AMYLASE in the last 8760 hours. No results for input(s): AMMONIA in the last 8760 hours. CBC: No results for input(s): WBC, NEUTROABS, HGB, HCT, MCV, PLT in the last 8760 hours. Lipid Panel: Recent Labs    05/05/17 1000  CHOL 162  HDL 45  LDLCALC 102*  TRIG 60  CHOLHDL 3.6   TSH: No results  for input(s): TSH in the last 8760 hours. A1C: Lab Results  Component Value Date   HGBA1C 5.5 03/11/2016     Assessment/Plan 1. Chronic pain of right ankle -changing shoes and aleve have been helpful but still bothered by this and wants evaluation from podiatry for possible orthotics  - Ambulatory referral to Podiatry  2. Osteoarthritis, unspecified osteoarthritis type, unspecified site Stable, using aleve occasionally.  - COMPLETE METABOLIC PANEL WITH GFR - CBC with Differential/Platelets  3. Mixed hyperlipidemia -has made dietary modifications, will follow up lipids today, ate banana this morning - Lipid Panel  4. Elevated blood pressure reading -improved today.   Next appt: 6 months for [employer]. [place], [facility] [phone]

## 2018-04-07 DIAGNOSIS — E559 Vitamin D deficiency, unspecified: Secondary | ICD-10-CM | POA: Diagnosis not present

## 2018-04-07 DIAGNOSIS — C61 Malignant neoplasm of prostate: Secondary | ICD-10-CM | POA: Diagnosis not present

## 2018-04-14 ENCOUNTER — Ambulatory Visit: Payer: Medicare HMO | Admitting: Podiatry

## 2018-04-14 ENCOUNTER — Ambulatory Visit (INDEPENDENT_AMBULATORY_CARE_PROVIDER_SITE_OTHER): Payer: Medicare HMO

## 2018-04-14 ENCOUNTER — Encounter: Payer: Self-pay | Admitting: Podiatry

## 2018-04-14 ENCOUNTER — Other Ambulatory Visit: Payer: Self-pay | Admitting: Podiatry

## 2018-04-14 VITALS — BP 140/75

## 2018-04-14 DIAGNOSIS — M779 Enthesopathy, unspecified: Secondary | ICD-10-CM

## 2018-04-14 DIAGNOSIS — M25571 Pain in right ankle and joints of right foot: Secondary | ICD-10-CM

## 2018-04-14 MED ORDER — TRIAMCINOLONE ACETONIDE 10 MG/ML IJ SUSP
10.0000 mg | Freq: Once | INTRAMUSCULAR | Status: AC
  Administered 2018-04-14: 10 mg

## 2018-04-14 NOTE — Patient Instructions (Signed)
Plantar Fasciitis      Plantar fasciitis is a painful foot condition that affects the heel. It occurs when the band of tissue that connects the toes to the heel bone (plantar fascia) becomes irritated. This can happen as the result of exercising too much or doing other repetitive activities (overuse injury).  The pain from plantar fasciitis can range from mild irritation to severe pain that makes it difficult to walk or move. The pain is usually worse in the morning after sleeping, or after sitting or lying down for a while. Pain may also be worse after long periods of walking or standing.  What are the causes?  This condition may be caused by:  Standing for long periods of time.  Wearing shoes that do not have good arch support.  Doing activities that put stress on joints (high-impact activities), including running, aerobics, and ballet.  Being overweight.  An abnormal way of walking (gait).  Tight muscles in the back of your lower leg (calf).  High arches in your feet.  Starting a new athletic activity.  What are the signs or symptoms?  The main symptom of this condition is heel pain. Pain may:  Be worse with first steps after a time of rest, especially in the morning after sleeping or after you have been sitting or lying down for a while.  Be worse after long periods of standing still.  Decrease after 30-45 minutes of activity, such as gentle walking.  How is this diagnosed?  This condition may be diagnosed based on your medical history and your symptoms. Your health care provider may ask questions about your activity level. Your health care provider will do a physical exam to check for:  A tender area on the bottom of your foot.  A high arch in your foot.  Pain when you move your foot.  Difficulty moving your foot.  You may have imaging tests to confirm the diagnosis, such as:  X-rays.  Ultrasound.  MRI.  How is this treated?  Treatment for plantar fasciitis depends on how severe your condition is.  Treatment may include:  Rest, ice, applying pressure (compression), and raising the affected foot (elevation). This may be called RICE therapy. Your health care provider may recommend RICE therapy along with over-the-counter pain medicines to manage your pain.  Exercises to stretch your calves and your plantar fascia.  A splint that holds your foot in a stretched, upward position while you sleep (night splint).  Physical therapy to relieve symptoms and prevent problems in the future.  Injections of steroid medicine (cortisone) to relieve pain and inflammation.  Stimulating your plantar fascia with electrical impulses (extracorporeal shock wave therapy). This is usually the last treatment option before surgery.  Surgery, if other treatments have not worked after 12 months.  Follow these instructions at home:      Managing pain, stiffness, and swelling  If directed, put ice on the painful area:  Put ice in a plastic bag, or use a frozen bottle of water.  Place a towel between your skin and the bag or bottle.  Roll the bottom of your foot over the bag or bottle.  Do this for 20 minutes, 2-3 times a day.  Wear athletic shoes that have air-sole or gel-sole cushions, or try wearing soft shoe inserts that are designed for plantar fasciitis.  Raise (elevate) your foot above the level of your heart while you are sitting or lying down.  Activity  Avoid activities that cause pain.   Ask your health care provider what activities are safe for you.  Do physical therapy exercises and stretches as told by your health care provider.  Try activities and forms of exercise that are easier on your joints (low-impact). Examples include swimming, water aerobics, and biking.  General instructions  Take over-the-counter and prescription medicines only as told by your health care provider.  Wear a night splint while sleeping, if told by your health care provider. Loosen the splint if your toes tingle, become numb, or turn cold and  blue.  Maintain a healthy weight, or work with your health care provider to lose weight as needed.  Keep all follow-up visits as told by your health care provider. This is important.  Contact a health care provider if you:  Have symptoms that do not go away after caring for yourself at home.  Have pain that gets worse.  Have pain that affects your ability to move or do your daily activities.  Summary  Plantar fasciitis is a painful foot condition that affects the heel. It occurs when the band of tissue that connects the toes to the heel bone (plantar fascia) becomes irritated.  The main symptom of this condition is heel pain that may be worse after exercising too much or standing still for a long time.  Treatment varies, but it usually starts with rest, ice, compression, and elevation (RICE therapy) and over-the-counter medicines to manage pain.  This information is not intended to replace advice given to you by your health care provider. Make sure you discuss any questions you have with your health care provider.  Document Released: 11/11/2000 Document Revised: 12/14/2016 Document Reviewed: 12/14/2016  Elsevier Interactive Patient Education © 2019 [employer]

## 2018-04-14 NOTE — Progress Notes (Signed)
Subjective:   Patient ID: Aaron Mall., male   DOB: 63 y.o.   MRN: [id number]   HPI Patient presents has a lot of pain in the right ankle and that it is been getting gradually worse over the last 2 years.  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  Patient does not currently smoke and likes to be active   Review of Systems  All other systems reviewed and are negative.       Objective:  Physical Exam Vitals signs and nursing note reviewed.  Constitutional:      Appearance: He is well-developed.  Pulmonary:     Effort: Pulmonary effort is normal.  Musculoskeletal: Normal range of motion.  Skin:    General: Skin is warm.  Neurological:     Mental Status: He is alert.     Neurovascular status found to be intact muscle strength is adequate range of motion within normal limits with acute discomfort in the sinus tarsi right with inflammation fluid around the medial band.  Patient is noted to have moderate depression of the arch and also moderate posterior tibial tendinitis-like symptoms right I was found to have good digital perfusion well oriented x3     Assessment:  Acute sinus tarsitis right with inflammation fluid noted with moderate posterior tibial tendinitis     Plan:  H&P conditions reviewed and today I did sterile prep and then injected the sinus tarsi right 3 mg Kenalog 5 mg Xylocaine and advised this patient on support and dispense fascial brace with considerations for orthotics in future.  Reappoint to recheck in 2 to 3 weeks  X-rays indicate that there is moderate depression of the arch with no indications of advanced arthritis or other pathology

## 2018-05-05 ENCOUNTER — Ambulatory Visit: Payer: Medicare HMO | Admitting: Podiatry

## 2018-05-12 DIAGNOSIS — N401 Enlarged prostate with lower urinary tract symptoms: Secondary | ICD-10-CM | POA: Diagnosis not present

## 2018-05-12 DIAGNOSIS — C61 Malignant neoplasm of prostate: Secondary | ICD-10-CM | POA: Diagnosis not present

## 2018-05-12 DIAGNOSIS — R351 Nocturia: Secondary | ICD-10-CM | POA: Diagnosis not present

## 2018-05-26 ENCOUNTER — Ambulatory Visit: Payer: Medicare HMO | Admitting: Podiatry

## 2018-05-30 ENCOUNTER — Encounter: Payer: Self-pay | Admitting: General Practice

## 2018-05-30 NOTE — Progress Notes (Signed)
[facility] Team contacted patient to assess for food insecurity and other psychosocial needs during current COVID19 pandemic.  LVM encouraging callback, but Team may phone again as well.    [place], [place], [facility] Pager [phone] Voicemail [phone]

## 2018-05-31 ENCOUNTER — Telehealth: Payer: Self-pay | Admitting: General Practice

## 2018-05-31 NOTE — Telephone Encounter (Signed)
[facility] Team contacted patient to assess for food insecurity and other psychosocial needs during current COVID19 pandemic.   Patient/family expressed concerns regarding access to food or managing costs of food during this time.  Support Team member discussed resources and will follow up as resources are identified.  Support Team member encouraged patient to call if changes occur or they have any other questions/concerns.      Beverely Pace, [place]ann[place]

## 2018-06-25 ENCOUNTER — Other Ambulatory Visit: Payer: Self-pay | Admitting: Nurse Practitioner

## 2018-06-27 ENCOUNTER — Other Ambulatory Visit: Payer: Self-pay | Admitting: *Deleted

## 2018-07-01 ENCOUNTER — Other Ambulatory Visit: Payer: Self-pay | Admitting: *Deleted

## 2018-07-01 MED ORDER — DICLOFENAC SODIUM 75 MG PO TBEC: 75.0000 mg | DELAYED_RELEASE_TABLET | Freq: Every day | ORAL | 0 refills | Status: DC

## 2018-07-01 NOTE — Telephone Encounter (Signed)
Patient requested refill

## 2018-09-08 DIAGNOSIS — C61 Malignant neoplasm of prostate: Secondary | ICD-10-CM | POA: Diagnosis not present

## 2018-09-12 DIAGNOSIS — C775 Secondary and unspecified malignant neoplasm of intrapelvic lymph nodes: Secondary | ICD-10-CM | POA: Diagnosis not present

## 2018-09-12 DIAGNOSIS — C61 Malignant neoplasm of prostate: Secondary | ICD-10-CM | POA: Diagnosis not present

## 2018-09-12 DIAGNOSIS — N401 Enlarged prostate with lower urinary tract symptoms: Secondary | ICD-10-CM | POA: Diagnosis not present

## 2018-09-12 DIAGNOSIS — N522 Drug-induced erectile dysfunction: Secondary | ICD-10-CM | POA: Diagnosis not present

## 2018-09-12 DIAGNOSIS — R351 Nocturia: Secondary | ICD-10-CM | POA: Diagnosis not present

## 2018-09-27 ENCOUNTER — Ambulatory Visit: Payer: Medicare HMO | Admitting: Nurse Practitioner

## 2018-09-27 ENCOUNTER — Encounter: Payer: Medicare HMO | Admitting: Nurse Practitioner

## 2018-10-06 ENCOUNTER — Encounter: Payer: Medicare HMO | Admitting: Nurse Practitioner

## 2018-10-13 ENCOUNTER — Ambulatory Visit (INDEPENDENT_AMBULATORY_CARE_PROVIDER_SITE_OTHER): Payer: Medicare HMO | Admitting: Nurse Practitioner

## 2018-10-13 ENCOUNTER — Ambulatory Visit: Payer: Medicare HMO | Admitting: Nurse Practitioner

## 2018-10-13 ENCOUNTER — Other Ambulatory Visit: Payer: Self-pay

## 2018-10-13 ENCOUNTER — Encounter: Payer: Self-pay | Admitting: Nurse Practitioner

## 2018-10-13 VITALS — BP 132/84 | HR 86 | Temp 98.2°F | Ht 74.5 in | Wt 232.6 lb

## 2018-10-13 VITALS — BP 132/84 | HR 86 | Temp 98.2°F | Ht 74.5 in | Wt 232.0 lb

## 2018-10-13 DIAGNOSIS — E782 Mixed hyperlipidemia: Secondary | ICD-10-CM

## 2018-10-13 DIAGNOSIS — C61 Malignant neoplasm of prostate: Secondary | ICD-10-CM | POA: Diagnosis not present

## 2018-10-13 DIAGNOSIS — M1990 Unspecified osteoarthritis, unspecified site: Secondary | ICD-10-CM | POA: Diagnosis not present

## 2018-10-13 DIAGNOSIS — Z0000 Encounter for general adult medical examination without abnormal findings: Secondary | ICD-10-CM

## 2018-10-13 DIAGNOSIS — E785 Hyperlipidemia, unspecified: Secondary | ICD-10-CM | POA: Insufficient documentation

## 2018-10-13 LAB — CBC WITH DIFFERENTIAL/PLATELET
Absolute Monocytes: 571 cells/uL (ref 200–950)
Basophils Absolute: 39 cells/uL (ref 0–200)
Basophils Relative: 0.7 %
Eosinophils Absolute: 151 cells/uL (ref 15–500)
Eosinophils Relative: 2.7 %
HCT: 39.5 % (ref 38.5–50.0)
Hemoglobin: 13.6 g/dL (ref 13.2–17.1)
Lymphs Abs: 1490 cells/uL (ref 850–3900)
MCH: 31.6 pg (ref 27.0–33.0)
MCHC: 34.4 g/dL (ref 32.0–36.0)
MCV: 91.9 fL (ref 80.0–100.0)
MPV: 10.5 fL (ref 7.5–12.5)
Monocytes Relative: 10.2 %
Neutro Abs: 3349 cells/uL (ref 1500–7800)
Neutrophils Relative %: 59.8 %
Platelets: 301 10*3/uL (ref 140–400)
RBC: 4.3 10*6/uL (ref 4.20–5.80)
RDW: 13 % (ref 11.0–15.0)
Total Lymphocyte: 26.6 %
WBC: 5.6 10*3/uL (ref 3.8–10.8)

## 2018-10-13 LAB — COMPLETE METABOLIC PANEL WITH GFR
AG Ratio: 1.1 (calc) (ref 1.0–2.5)
ALT: 17 U/L (ref 9–46)
AST: 26 U/L (ref 10–35)
Albumin: 4.1 g/dL (ref 3.6–5.1)
Alkaline phosphatase (APISO): 80 U/L (ref 35–144)
BUN/Creatinine Ratio: 19 (calc) (ref 6–22)
BUN: 11 mg/dL (ref 7–25)
CO2: 23 mmol/L (ref 20–32)
Calcium: 9.9 mg/dL (ref 8.6–10.3)
Chloride: 107 mmol/L (ref 98–110)
Creat: 0.57 mg/dL — ABNORMAL LOW (ref 0.70–1.25)
GFR, Est African American: 127 mL/min/{1.73_m2} (ref >=60)
GFR, Est Non African American: 109 mL/min/{1.73_m2} (ref >=60)
Globulin: 3.6 g/dL (calc) (ref 1.9–3.7)
Glucose, Bld: 82 mg/dL (ref 65–139)
Potassium: 4.3 mmol/L (ref 3.5–5.3)
Sodium: 138 mmol/L (ref 135–146)
Total Bilirubin: 0.8 mg/dL (ref 0.2–1.2)
Total Protein: 7.7 g/dL (ref 6.1–8.1)

## 2018-10-13 LAB — LIPID PANEL
Cholesterol: 211 mg/dL — ABNORMAL HIGH (ref <200)
HDL: 48 mg/dL (ref >=40)
LDL Cholesterol (Calc): 142 mg/dL (calc) — ABNORMAL HIGH
Non-HDL Cholesterol (Calc): 163 mg/dL (calc) — ABNORMAL HIGH (ref <130)
Total CHOL/HDL Ratio: 4.4 (calc) (ref <5.0)
Triglycerides: 97 mg/dL (ref <150)

## 2018-10-13 NOTE — Patient Instructions (Signed)
[name] , Thank you for taking time to come for your Medicare Wellness Visit. I appreciate your ongoing commitment to your health goals. Please review the following plan we discussed and let me know if I can assist you in the future.   Screening recommendations/referrals: Colonoscopy - to complete cologuard  Recommended yearly ophthalmology/optometry visit for glaucoma screening and checkup Recommended yearly dental visit for hygiene and checkup  Vaccinations: Influenza vaccine- declines  Pneumococcal vaccine- declines  Tdap vaccine -declines Shingles vaccine declines    Advanced directives: handout given, please review and complete,  notarize and bring copy back to office so we can have on file  Conditions/risks identified: increase exercise to increase strength, continue to NOT smoke! Way to go on quiting  Next appointment: 1 year  Preventive Care 40-64 Years, Male Preventive care refers to lifestyle choices and visits with your health care provider that can promote health and wellness. What does preventive care include?  A yearly physical exam. This is also called an annual well check.  Dental exams once or twice a year.  Routine eye exams. Ask your health care provider how often you should have your eyes checked.  Personal lifestyle choices, including:  Daily care of your teeth and gums.  Regular physical activity.  Eating a healthy diet.  Avoiding tobacco and drug use.  Limiting alcohol use.  Practicing safe sex.  Taking low-dose aspirin every day starting at age 31. What happens during an annual well check? The services and screenings done by your health care provider during your annual well check will depend on your age, overall health, lifestyle risk factors, and family history of disease. Counseling  Your health care provider may ask you questions about your:  Alcohol use.  Tobacco use.  Drug use.  Emotional well-being.  Home and relationship  well-being.  Sexual activity.  Eating habits.  Work and work Statistician. Screening  You may have the following tests or measurements:  Height, weight, and BMI.  Blood pressure.  Lipid and cholesterol levels. These may be checked every 5 years, or more frequently if you are over 84 years old.  Skin check.  Lung cancer screening. You may have this screening every year starting at age 72 if you have a 30-pack-year history of smoking and currently smoke or have quit within the past 15 years.  Fecal occult blood test (FOBT) of the stool. You may have this test every year starting at age 59.  Flexible sigmoidoscopy or colonoscopy. You may have a sigmoidoscopy every 5 years or a colonoscopy every 10 years starting at age 60.  Prostate cancer screening. Recommendations will vary depending on your family history and other risks.  Hepatitis C blood test.  Hepatitis B blood test.  Sexually transmitted disease (STD) testing.  Diabetes screening. This is done by checking your blood sugar (glucose) after you have not eaten for a while (fasting). You may have this done every 1-3 years. Discuss your test results, treatment options, and if necessary, the need for more tests with your health care provider. Vaccines  Your health care provider may recommend certain vaccines, such as:  Influenza vaccine. This is recommended every year.  Tetanus, diphtheria, and acellular pertussis (Tdap, Td) vaccine. You may need a Td booster every 10 years.  Zoster vaccine. You may need this after age 32.  Pneumococcal 13-valent conjugate (PCV13) vaccine. You may need this if you have certain conditions and have not been vaccinated.  Pneumococcal polysaccharide (PPSV23) vaccine. You may need  one or two doses if you smoke cigarettes or if you have certain conditions. Talk to your health care provider about which screenings and vaccines you need and how often you need them. This information is not intended  to replace advice given to you by your health care provider. Make sure you discuss any questions you have with your health care provider. Document Released: 03/15/2015 Document Revised: 11/06/2015 Document Reviewed: 12/18/2014 Elsevier Interactive Patient Education  2017 [place] Prevention in the Home Falls can cause injuries. They can happen to people of all ages. There are many things you can do to make your home safe and to help prevent falls. What can I do on the outside of my home?  Regularly fix the edges of walkways and driveways and fix any cracks.  Remove anything that might make you trip as you walk through a door, such as a raised step or threshold.  Trim any bushes or trees on the path to your home.  Use bright outdoor lighting.  Clear any walking paths of anything that might make someone trip, such as rocks or tools.  Regularly check to see if handrails are loose or broken. Make sure that both sides of any steps have handrails.  Any raised decks and porches should have guardrails on the edges.  Have any leaves, snow, or ice cleared regularly.  Use sand or salt on walking paths during winter.  Clean up any spills in your garage right away. This includes oil or grease spills. What can I do in the bathroom?  Use night lights.  Install grab bars by the toilet and in the tub and shower. Do not use towel bars as grab bars.  Use non-skid mats or decals in the tub or shower.  If you need to sit down in the shower, use a plastic, non-slip stool.  Keep the floor dry. Clean up any water that spills on the floor as soon as it happens.  Remove soap buildup in the tub or shower regularly.  Attach bath mats securely with double-sided non-slip rug tape.  Do not have throw rugs and other things on the floor that can make you trip. What can I do in the bedroom?  Use night lights.  Make sure that you have a light by your bed that is easy to reach.  Do not use  any sheets or blankets that are too big for your bed. They should not hang down onto the floor.  Have a firm chair that has side arms. You can use this for support while you get dressed.  Do not have throw rugs and other things on the floor that can make you trip. What can I do in the kitchen?  Clean up any spills right away.  Avoid walking on wet floors.  Keep items that you use a lot in easy-to-reach places.  If you need to reach something above you, use a strong step stool that has a grab bar.  Keep electrical cords out of the way.  Do not use floor polish or wax that makes floors slippery. If you must use wax, use non-skid floor wax.  Do not have throw rugs and other things on the floor that can make you trip. What can I do with my stairs?  Do not leave any items on the stairs.  Make sure that there are handrails on both sides of the stairs and use them. Fix handrails that are broken or loose. Make sure that  handrails are as long as the stairways.  Check any carpeting to make sure that it is firmly attached to the stairs. Fix any carpet that is loose or worn.  Avoid having throw rugs at the top or bottom of the stairs. If you do have throw rugs, attach them to the floor with carpet tape.  Make sure that you have a light switch at the top of the stairs and the bottom of the stairs. If you do not have them, ask someone to add them for you. What else can I do to help prevent falls?  Wear shoes that:  Do not have high heels.  Have rubber bottoms.  Are comfortable and fit you well.  Are closed at the toe. Do not wear sandals.  If you use a stepladder:  Make sure that it is fully opened. Do not climb a closed stepladder.  Make sure that both sides of the stepladder are locked into place.  Ask someone to hold it for you, if possible.  Clearly mark and make sure that you can see:  Any grab bars or handrails.  First and last steps.  Where the edge of each step is.   Use tools that help you move around (mobility aids) if they are needed. These include:  Canes.  Walkers.  Scooters.  Crutches.  Turn on the lights when you go into a dark area. Replace any light bulbs as soon as they burn out.  Set up your furniture so you have a clear path. Avoid moving your furniture around.  If any of your floors are uneven, fix them.  If there are any pets around you, be aware of where they are.  Review your medicines with your doctor. Some medicines can make you feel dizzy. This can increase your chance of falling. Ask your doctor what other things that you can do to help prevent falls. This information is not intended to replace advice given to you by your health care provider. Make sure you discuss any questions you have with your health care provider. Document Released: 12/13/2008 Document Revised: 07/25/2015 Document Reviewed: 03/23/2014 Elsevier Interactive Patient Education  2017 [employer].

## 2018-10-13 NOTE — Progress Notes (Signed)
 Provider: Eubanks, Jessica K, NP  Patient Care Team: Eubanks, Jessica K, NP as PCP - General (Geriatric Medicine)  Extended Emergency Contact Information Primary Emergency Contact: Budden,Gen[place] Address: [address]          [place], [place] [address] [place] Home Phone: [phone] Relation: Spouse No Known Allergies Code Status: FULL Goals of Care: Advanced Directive information Advanced Directives 10/13/2018  Does Patient Have a Medical Advance Directive? No  Would patient like information on creating a medical advance directive? No - Patient declined     Chief Complaint  Patient presents with  . Annual Exam    extended visit  . Health Maintenance    influenza vaccine     HPI: Patient is a 63 y.o. male seen in today for an annual wellness exam.    Diet? None  Exercise? 15 mins 5 days a week    Dentition: dentures   Ophthalmology appt: plan to follow up, need to schedule appt  Routine specialist: urologist due to prostate cancer.     declines vaccines  Agreeable to cologuard  Depression screen PHQ 2/9 03/24/2018 03/10/2017  Decreased Interest 0 0  Down, Depressed, Hopeless 0 0  PHQ - 2 Score 0 0    Fall Risk  10/13/2018 03/24/2018 05/07/2017 03/10/2017  Falls in the past year? 0 0 No No  Number falls in past yr: 0 0 - -  Injury with Fall? 0 0 - -   MMSE - Mini Mental State Exam 10/13/2018  Orientation to time 5  Orientation to Place 5  Registration 3  Attention/ Calculation 5  Recall 3  Language- name 2 objects 2  Language- repeat 1  Language- follow 3 step command 3  Language- read & follow direction 0  Write a sentence 1  Copy design 1  Total score 29     Health Maintenance  Topic Date Due  . Fecal DNA (Cologuard)  10/05/2005  . INFLUENZA VACCINE  10/01/2018  . TETANUS/TDAP  03/25/2019 (Originally 10/06/1974)  . Hepatitis C Screening  Completed  . HIV Screening  Completed    Past Medical History:  Diagnosis Date  . Arthritis    . Chest pain 08/28/2016   had pleurisy  . Dyslipidemia   . Electrocardiogram suggestive of ST elevation myocardial infarction (STEMI) (HCC) 08/28/2016  . Elevated PSA    03/11/16 PSA of 98.0  . Essential hypertension   . Fatigue   . Hematuria    due to prostate  . Other visual disturbances   . Overactive bladder   . Pericarditis    a. diagnosed in 07/2016 - cath showed no angiographic evidence of CAD.   . Pneumothorax 1986  . Prostate cancer (HCC)   . Tobacco abuse   . Urinary urgency   . Vitamin D deficiency     Past Surgical History:  Procedure Laterality Date  . DENTAL SURGERY     all teeth removed/ has complete denture  . LEFT HEART CATH AND CORONARY ANGIOGRAPHY N/A 08/28/2016   Procedure: Left Heart Cath and Coronary Angiography;  Surgeon: McAlhany, Christopher D, MD;  Location: [facility];  Service: Cardiovascular;  Laterality: N/A;  . PROSTATE BIOPSY    . WISDOM TOOTH EXTRACTION      Social History   Socioeconomic History  . Marital status: Married    Spouse name: Not on file  . Number of children: 1  . Years of education: Not on file  . Highest education level: Not   on file  Occupational History  . 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 Financial resource strain: Not on file  . Food insecurity    Worry: Not on file    Inability: Not on file  . Transportation needs    Medical: Not on file    Non-medical: Not on file  Tobacco Use  . Smoking status: Former Smoker    Packs/day: 0.25    Years: 35.00    Pack years: 8.75    Types: Cigarettes    Quit date: 10/10/2018  . Smokeless tobacco: Never Used  . Tobacco comment: smoke about 3 cigarettes daily  Substance and Sexual Activity  . Alcohol use: Yes    Comment: seldomly- holidays/birthdays  . Drug use: Yes    Frequency: 2.0 times per week    Types: Marijuana    Comment: smokes marijuana 2-3 times a week to help with pain/stiffness in the mornings.   .  Sexual activity: Yes  Lifestyle  . Physical activity    Days per week: Not on file    Minutes per session: Not on file  . Stress: Not on file  Relationships  . Social connections    Talks on phone: Not on file    Gets together: Not on file    Attends religious service: Not on file    Active member of club or organization: Not on file    Attends meetings of clubs or organizations: Not on file    Relationship status: Not on file  Other Topics Concern  . Not on file  Social History Narrative   Social History      Diet?      Do you drink/eat things with caffeine? no      Marital status?              Married                      What year were you married? 2017      Do you live in a house, apartment, assisted living, condo, trailer, etc.?  house      Is it one or more stories? no      How many persons live in your home? 2      Do you have any pets in your home? (please list) no      Highest level of education completed? 12th      Current or past profession: custodian       Do you exercise?          On my job                            Type & how often?      Advanced Directives      Do you have a living will? no      Do you have a DNR form?                                  If not, do you want to discuss one? no      Do you have signed POA/HPOA for forms? no      Functional Status      Do you have difficulty bathing or dressing yourself? no      Do you have   difficulty preparing food or eating? no      Do you have difficulty managing your medications? no      Do you have difficulty managing your finances? no      Do you have difficulty affording your medications? no    Family History  Problem Relation Age of Onset  . Hypertension Mother   . Arthritis Mother   . Stroke Father   . Heart disease Father   . Diabetes Sister   . Cancer Maternal Grandfather        prostate     Review of Systems:  Review of Systems  Constitutional: Negative for chills, fever  and weight loss.  HENT: Negative for tinnitus.   Respiratory: Negative for cough, sputum production and shortness of breath.   Cardiovascular: Negative for chest pain, palpitations and leg swelling.  Gastrointestinal: Negative for abdominal pain, constipation, diarrhea and heartburn.  Genitourinary: Negative for dysuria, frequency and urgency.       Gets up once at night to urinate, prostate cancer  Musculoskeletal: Positive for joint pain. Negative for back pain, falls and myalgias.       Arthritis in knees, shoulders, arms and ankles. Worse since on Lupron but has improved with exercise   Skin: Negative.   Neurological: Negative for dizziness and headaches.  Psychiatric/Behavioral: Negative for depression and memory loss. The patient does not have insomnia.      Allergies as of 10/13/2018   No Known Allergies     Medication List       Accurate as of October 13, 2018  1:10 PM. If you have any questions, ask your nurse or doctor.        STOP taking these medications   AMBULATORY NON FORMULARY MEDICATION Stopped by: Jessica K Eubanks, NP   diclofenac 75 MG EC tablet Commonly known as: VOLTAREN Stopped by: Jessica K Eubanks, NP   tamsulosin 0.4 MG Caps capsule Commonly known as: Flomax Stopped by: Jessica K Eubanks, NP     TAKE these medications   CENTRUM PO Take 1 tablet by mouth daily.   sildenafil 20 MG tablet Commonly known as: REVATIO TAKE 1 TO 5 TABLETS BY MOUTH AS NEEDED   vitamin C 1000 MG tablet Take 1,000 mg by mouth daily. Takes two tablets per day         Physical Exam: Vitals:   10/13/18 0922  BP: 132/84  Pulse: 86  Temp: 98.2 F (36.8 C)  SpO2: 95%  Weight: 232 lb (105.2 kg)  Height: 6' 2.5" (1.892 m)   Body mass index is 29.39 kg/m. Wt Readings from Last 3 Encounters:  10/13/18 232 lb 9.6 oz (105.5 kg)  10/13/18 232 lb (105.2 kg)  03/24/18 234 lb (106.1 kg)    Physical Exam Constitutional:      General: He is not in acute  distress.    Appearance: He is well-developed. He is not diaphoretic.  HENT:     Head: Normocephalic and atraumatic.     Right Ear: Tympanic membrane and ear canal normal.     Left Ear: Tympanic membrane and ear canal normal.     Mouth/Throat:     Pharynx: No oropharyngeal exudate or posterior oropharyngeal erythema.  Eyes:     Conjunctiva/sclera: Conjunctivae normal.     Pupils: Pupils are equal, round, and reactive to light.  Neck:     Musculoskeletal: Normal range of motion and neck supple.  Cardiovascular:     Rate and Rhythm: Normal rate and regular   rhythm.     Heart sounds: Normal heart sounds.  Pulmonary:     Effort: Pulmonary effort is normal. No respiratory distress.     Breath sounds: Normal breath sounds. No wheezing or rales.  Abdominal:     General: Bowel sounds are normal. There is no distension.     Palpations: Abdomen is soft.     Tenderness: There is no abdominal tenderness.  Musculoskeletal: Normal range of motion.        General: No tenderness.  Skin:    General: Skin is warm and dry.  Neurological:     Mental Status: He is alert and oriented to person, place, and time.     Cranial Nerves: No cranial nerve deficit.     Coordination: Coordination normal.     Labs reviewed: Basic Metabolic Panel: Recent Labs    03/24/18 0914  NA 140  K 4.4  CL 106  CO2 26  GLUCOSE 87  BUN 9  CREATININE 0.80  CALCIUM 9.8   Liver Function Tests: Recent Labs    03/24/18 0914  AST 32  ALT 36  BILITOT 0.7  PROT 7.4   No results for input(s): LIPASE, AMYLASE in the last 8760 hours. No results for input(s): AMMONIA in the last 8760 hours. CBC: Recent Labs    03/24/18 0914  WBC 5.2  NEUTROABS 2,943  HGB 13.9  HCT 39.7  MCV 95.0  PLT 292   Lipid Panel: Recent Labs    03/24/18 0914  CHOL 192  HDL 49  LDLCALC 121*  TRIG 109  CHOLHDL 3.9   Lab Results  Component Value Date   HGBA1C 5.5 03/11/2016    Procedures: No results found.   Assessment/Plan 1. Mixed hyperlipidemia -encouraged dietary modifications, continue exercise as tolerates.  - Lipid Panel - CBC with Differential/Platelet - CMP with eGFR([facility])  2. Malignant neoplasm of prostate (HCC) Completed radiation; almost completed Lupron injections (done in December for a total 2 year). Continue with close follow up by urologist. - CBC with Differential/Platelet  3. Osteoarthritis, unspecified osteoarthritis type, unspecified site -stable. Has been exercising legs to help strength which has improved knee pain. - CBC with Differential/Platelet  4. Preventative health care -continues to follow up with urologist for prostate CA, agreeable to cologuard. - The patient was counseled regarding the appropriate use of alcohol, regular self-examination of the breasts on a monthly basis, prevention of dental and periodontal disease, diet, regular sustained exercise for at least 30 minutes 5 times per week, testicular self-examination on a monthly basis,smoking cessation, tobacco use,  and recommended schedule for GI hemoccult testing, colonoscopy, cholesterol, thyroid and diabetes screening. - Lipid Panel - CBC with Differential/Platelet - CMP with eGFR([facility])  Next appt: 6 months Jessica K. Eubanks, AGNP  [facility] [phone]  

## 2018-10-13 NOTE — Progress Notes (Signed)
Subjective:   Aaron Adkins. is a 63 y.o. male who presents for a Welcome to Medicare exam.   Review of Systems: Cardiac Risk Factors include: advanced age (>43men, >25 women);male gender;hypertension     Objective:    Today's Vitals   10/13/18 0900  BP: 132/84  Pulse: 86  Temp: 98.2 F (36.8 C)  TempSrc: Oral  SpO2: 95%  Weight: 232 lb 9.6 oz (105.5 kg)  Height: 6' 2.5" (1.892 m)   Body mass index is 29.46 kg/m.  Medications Outpatient Encounter Medications as of 10/13/2018  Medication Sig  . Ascorbic Acid (VITAMIN C) 1000 MG tablet Take 1,000 mg by mouth daily. Takes two tablets per day  . diclofenac (VOLTAREN) 75 MG EC tablet Take 1 tablet (75 mg total) by mouth daily.  . Multiple Vitamins-Minerals (CENTRUM PO) Take 1 tablet by mouth daily.  . sildenafil (REVATIO) 20 MG tablet TAKE 1 TO 5 TABLETS BY MOUTH AS NEEDED  . tamsulosin (FLOMAX) 0.4 MG CAPS capsule Take 1 capsule (0.4 mg total) by mouth daily after supper.  . [DISCONTINUED] AMBULATORY NON FORMULARY MEDICATION Prosvent: take 1 tablet by mouth daily for prostate health.   No facility-administered encounter medications on file as of 10/13/2018.      History: Past Medical History:  Diagnosis Date  . Arthritis   . Chest pain 08/28/2016   had pleurisy  . Dyslipidemia   . Electrocardiogram suggestive of ST elevation myocardial infarction (STEMI) ([place]) 08/28/2016  . Elevated PSA    03/11/16 PSA of 98.0  . Essential hypertension   . Fatigue   . Hematuria    due to prostate  . Other visual disturbances   . Overactive bladder   . Pericarditis    a. diagnosed in 07/2016 - cath showed no angiographic evidence of CAD.   Marland Kitchen Pneumothorax 1986  . Prostate cancer ([place])   . Tobacco abuse   . Urinary urgency   . Vitamin D deficiency    Past Surgical History:  Procedure Laterality Date  . DENTAL SURGERY     all teeth removed/ has complete denture  . LEFT HEART CATH AND CORONARY ANGIOGRAPHY N/A 08/28/2016   Procedure: Left Heart Cath and Coronary Angiography;  Surgeon: Burnell Blanks, MD;  Location: [facility];  Service: Cardiovascular;  Laterality: N/A;  . PROSTATE BIOPSY    . WISDOM TOOTH EXTRACTION      Family History  Problem Relation Age of Onset  . Hypertension Mother   . Arthritis Mother   . Stroke Father   . Heart disease Father   . Diabetes Sister   . Cancer Maternal Grandfather        prostate    Social History   Occupational History  . 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 Smoking status: Former Smoker    Packs/day: 0.25    Years: 35.00    Pack years: 8.75    Types: Cigarettes    Quit date: 10/10/2018  . Smokeless tobacco: Never Used  . Tobacco comment: smoke about 3 cigarettes daily  Substance and Sexual Activity  . Alcohol use: Yes    Comment: seldomly- holidays/birthdays  . Drug use: Yes    Frequency: 2.0 times per week    Types: Marijuana    Comment: smokes marijuana 2-3 times a week to help with pain/stiffness in the mornings.   . Sexual activity: Yes   Tobacco Counseling Counseling given: Not  Answered Comment: smoke about 3 cigarettes daily   Immunizations and Health Maintenance Immunization History  Administered Date(s) Administered  . Influenza,inj,Quad PF,6+ Mos 03/10/2017   Health Maintenance Due  Topic Date Due  . Fecal DNA (Cologuard)  10/05/2005  . INFLUENZA VACCINE  10/01/2018    Activities of Daily Living In your present state of health, do you have any difficulty performing the following activities: 10/13/2018  Hearing? N  Vision? N  Difficulty concentrating or making decisions? N  Walking or climbing stairs? Y  Comment lupron shots make him weak and swollen, doing exercise  Dressing or bathing? N  Doing errands, shopping? N  Preparing Food and eating ? N  Using the Toilet? N  In the past six months, have you accidently leaked urine? N  Do you have problems with loss  of bowel control? N  Managing your Medications? N  Managing your Finances? N  Housekeeping or managing your Housekeeping? N  Some recent data might be hidden     Advanced Directives: Does Patient Have a Medical Advance Directive?: No Would patient like information on creating a medical advance directive?: No - Patient declined    Assessment:    This is a routine wellness  examination for this patient .   Vision/Hearing screen  Hearing Screening   125Hz  250Hz  500Hz  1000Hz  2000Hz  3000Hz  4000Hz  6000Hz  8000Hz   Right ear:   20 20 20  20     Left ear:   25 25 25  25       Visual Acuity Screening   Right eye Left eye Both eyes  Without correction: 20/25 20/20 20/25   With correction:     Comments: Will call make an appointment for eye exam   Dietary issues and exercise activities discussed:  Current Exercise Habits: Home exercise routine, Type of exercise: strength training/weights;walking, Time (Minutes): 15, Frequency (Times/Week): 5, Weekly Exercise (Minutes/Week): 75, Intensity: Moderate  Goals    . Exercise 150 min/wk Moderate Activity     Increase exercise and incorporate weights    . Quit Smoking     Continue to NOT smoke        Depression Screen PHQ 2/9 Scores 03/24/2018 03/10/2017  PHQ - 2 Score 0 0     Fall Risk Fall Risk  10/13/2018  Falls in the past year? 0  Number falls in past yr: 0  Injury with Fall? 0    Cognitive Function MMSE - Mini Mental State Exam 10/13/2018  Orientation to time 5  Orientation to Place 5  Registration 3  Attention/ Calculation 5  Recall 3  Language- name 2 objects 2  Language- repeat 1  Language- follow 3 step command 3  Language- read & follow direction 0  Write a sentence 1  Copy design 1  Total score 29        Patient Care Team: [name], NP as PCP - General (Geriatric Medicine)     Plan:     I have personally reviewed and noted the following in the patient's chart:   . Medical and social history .  Use of alcohol, tobacco or illicit drugs  . Current medications and supplements . Functional ability and status . Nutritional status . Physical activity . Advanced directives . List of other physicians . Hospitalizations, surgeries, and ER visits in previous 12 months . Vitals . Screenings to include cognitive, depression, and falls . Referrals and appointments  In addition, I have reviewed and discussed with patient certain preventive protocols, quality metrics,  and best practice recommendations. A written personalized care plan for preventive services as well as general preventive health recommendations were provided to patient.    [name], NP 10/13/2018

## 2018-10-13 NOTE — Patient Instructions (Addendum)
DASH Eating Plan DASH stands for "Dietary Approaches to Stop Hypertension." The DASH eating plan is a healthy eating plan that has been shown to reduce high blood pressure (hypertension). It may also reduce your risk for type 2 diabetes, heart disease, and stroke. The DASH eating plan may also help with weight loss. What are tips for following this plan?  General guidelines  Avoid eating more than 2,300 mg (milligrams) of salt (sodium) a day. If you have hypertension, you may need to reduce your sodium intake to 1,500 mg a day.  Limit alcohol intake to no more than 1 drink a day for nonpregnant women and 2 drinks a day for men. One drink equals 12 oz of beer, 5 oz of wine, or 1 oz of hard liquor.  Work with your health care provider to maintain a healthy body weight or to lose weight. Ask what an ideal weight is for you.  Get at least 30 minutes of exercise that causes your heart to beat faster (aerobic exercise) most days of the week. Activities may include walking, swimming, or biking.  Work with your health care provider or diet and nutrition specialist (dietitian) to adjust your eating plan to your individual calorie needs. Reading food labels   Check food labels for the amount of sodium per serving. Choose foods with less than 5 percent of the Daily Value of sodium. Generally, foods with less than 300 mg of sodium per serving fit into this eating plan.  To find whole grains, look for the word "whole" as the first word in the ingredient list. Shopping  Buy products labeled as "low-sodium" or "no salt added."  Buy fresh foods. Avoid canned foods and premade or frozen meals. Cooking  Avoid adding salt when cooking. Use salt-free seasonings or herbs instead of table salt or sea salt. Check with your health care provider or pharmacist before using salt substitutes.  Do not fry foods. Cook foods using healthy methods such as baking, boiling, grilling, and broiling instead.   Cook with heart-healthy oils, such as olive, canola, soybean, or sunflower oil. Meal planning  Eat a balanced diet that includes: ? 5 or more servings of fruits and vegetables each day. At each meal, try to fill half of your plate with fruits and vegetables. ? Up to 6-8 servings of whole grains each day. ? Less than 6 oz of lean meat, poultry, or fish each day. A 3-oz serving of meat is about the same size as a deck of cards. One egg equals 1 oz. ? 2 servings of low-fat dairy each day. ? A serving of nuts, seeds, or beans 5 times each week. ? Heart-healthy fats. Healthy fats called Omega-3 fatty acids are found in foods such as flaxseeds and coldwater fish, like sardines, salmon, and mackerel.  Limit how much you eat of the following: ? Canned or prepackaged foods. ? Food that is high in trans fat, such as fried foods. ? Food that is high in saturated fat, such as fatty meat. ? Sweets, desserts, sugary drinks, and other foods with added sugar. ? Full-fat dairy products.  Do not salt foods before eating.  Try to eat at least 2 vegetarian meals each week.  Eat more home-cooked food and less restaurant, buffet, and fast food.  When eating at a restaurant, ask that your food be prepared with less salt or no salt, if possible. What foods are recommended? The items listed may not be a complete list. Talk  with your dietitian about what dietary choices are best for you. Grains Whole-grain or whole-wheat bread. Whole-grain or whole-wheat pasta. Brown rice. [place]. Bulgur. Whole-grain and low-sodium cereals. Pita bread. Low-fat, low-sodium crackers. Whole-wheat flour tortillas. Vegetables Fresh or frozen vegetables (raw, steamed, roasted, or grilled). Low-sodium or reduced-sodium tomato and vegetable juice. Low-sodium or reduced-sodium tomato sauce and tomato paste. Low-sodium or reduced-sodium canned vegetables. Fruits All fresh, dried, or frozen fruit. Canned fruit in natural juice  (without added sugar). Meat and other protein foods Skinless chicken or [place]. Ground chicken or [place]. Pork with fat trimmed off. Fish and seafood. Egg whites. Dried beans, peas, or lentils. Unsalted nuts, nut butters, and seeds. Unsalted canned beans. Lean cuts of beef with fat trimmed off. Low-sodium, lean deli meat. Dairy Low-fat (1%) or fat-free (skim) milk. Fat-free, low-fat, or reduced-fat cheeses. Nonfat, low-sodium ricotta or cottage cheese. Low-fat or nonfat yogurt. Low-fat, low-sodium cheese. Fats and oils Soft margarine without trans fats. Vegetable oil. Low-fat, reduced-fat, or light mayonnaise and salad dressings (reduced-sodium). Canola, safflower, olive, soybean, and sunflower oils. Avocado. Seasoning and other foods Herbs. Spices. Seasoning mixes without salt. Unsalted popcorn and pretzels. Fat-free sweets. What foods are not recommended? The items listed may not be a complete list. Talk with your dietitian about what dietary choices are best for you. Grains Baked goods made with fat, such as croissants, muffins, or some breads. Dry pasta or rice meal packs. Vegetables Creamed or fried vegetables. Vegetables in a cheese sauce. Regular canned vegetables (not low-sodium or reduced-sodium). Regular canned tomato sauce and paste (not low-sodium or reduced-sodium). Regular tomato and vegetable juice (not low-sodium or reduced-sodium). Angie Fava. Olives. Fruits Canned fruit in a light or heavy syrup. Fried fruit. Fruit in cream or butter sauce. Meat and other protein foods Fatty cuts of meat. Ribs. Fried meat. Berniece Salines. Sausage. Bologna and other processed lunch meats. Salami. Fatback. Hotdogs. Bratwurst. Salted nuts and seeds. Canned beans with added salt. Canned or smoked fish. Whole eggs or egg yolks. Chicken or [place] with skin. Dairy Whole or 2% milk, cream, and half-and-half. Whole or full-fat cream cheese. Whole-fat or sweetened yogurt. Full-fat cheese. Nondairy creamers. Whipped  toppings. Processed cheese and cheese spreads. Fats and oils Butter. Stick margarine. Lard. Shortening. Ghee. Bacon fat. Tropical oils, such as coconut, palm kernel, or palm oil. Seasoning and other foods Salted popcorn and pretzels. Onion salt, garlic salt, seasoned salt, table salt, and sea salt. Worcestershire sauce. Tartar sauce. Barbecue sauce. Teriyaki sauce. Soy sauce, including reduced-sodium. Steak sauce. Canned and packaged gravies. Fish sauce. Oyster sauce. Cocktail sauce. Horseradish that you find on the shelf. Ketchup. Mustard. Meat flavorings and tenderizers. Bouillon cubes. Hot sauce and Tabasco sauce. Premade or packaged marinades. Premade or packaged taco seasonings. Relishes. Regular salad dressings. Where to find more information:  National Heart, Lung, and [place]: https://wilson-eaton.com/  American Heart Association: www.heart.org Summary  The DASH eating plan is a healthy eating plan that has been shown to reduce high blood pressure (hypertension). It may also reduce your risk for type 2 diabetes, heart disease, and stroke.  With the DASH eating plan, you should limit salt (sodium) intake to 2,300 mg a day. If you have hypertension, you may need to reduce your sodium intake to 1,500 mg a day.  When on the DASH eating plan, aim to eat more fresh fruits and vegetables, whole grains, lean proteins, low-fat dairy, and heart-healthy fats.  Work with your health care provider or diet and nutrition specialist (dietitian) to adjust your  eating plan to your individual calorie needs. This information is not intended to replace advice given to you by your health care provider. Make sure you discuss any questions you have with your health care provider. Document Released: 02/05/2011 Document Revised: 01/29/2017 Document Reviewed: 02/10/2016 Elsevier Patient Education  2020 [place] These Tests  Blood Pressure- Have your blood pressure checked by your healthcare  provider at least once a year.  Normal blood pressure is 120/80. Goal for most <140/90.  Weight- Have your body mass index (BMI) calculated to screen for obesity.  BMI is a measure of body fat based on height and weight.  You can calculate your own BMI at GravelBags.it  Cholesterol- Have your cholesterol checked every year.  Diabetes- Have your blood sugar checked every year if you have high blood pressure, high cholesterol, a family history of diabetes or if you are overweight.  Pap Test - Have a pap test every 1 to 5 years if you have been sexually active.  If you are older than 65 and recent pap tests have been normal you may not need additional pap tests.  In addition, if you have had a hysterectomy  for benign disease additional pap tests are not necessary.  Mammogram-Yearly mammograms are essential for early detection of breast cancer  Screening for Colon Cancer- Colonoscopy starting at age 32. Screening may begin sooner depending on your family history and other health conditions.  Follow up colonoscopy as directed by your Gastroenterologist.  Screening for Osteoporosis- Screening begins at age 26 with bone density scanning, sooner if you are at higher risk for developing Osteoporosis.   Get these medicines  Calcium with Vitamin D- Your body requires 1200-1500 mg of Calcium a day and [phone] IU of Vitamin D a day.  You can only absorb 500 mg of Calcium at a time therefore Calcium must be taken in 2 or 3 separate doses throughout the day.  Hormones- Hormone therapy has been associated with increased risk for certain cancers and heart disease.  Talk to your healthcare provider about if you need relief from menopausal symptoms.  Aspirin- Ask your healthcare provider about taking Aspirin to prevent Heart Disease and Stroke.   Get these Immuniztions  Flu shot- Every fall  Pneumonia shot- Once after the age of 72; if you are younger ask your healthcare provider if you need a  pneumonia shot.  Tetanus- Every ten years.  Shingrix- Once after the age of 45 to prevent shingles.   Take these steps  Don't smoke- Your healthcare provider can help you quit. For tips on how to quit, ask your healthcare provider or go to www.smokefree.gov or call 1-800 QUIT-NOW.  Be physically active- Exercise 5 days a week for a minimum of 30 minutes.  If you are not already physically active, start slow and gradually work up to 30 minutes of moderate physical activity.  Try walking, dancing, bike riding, swimming, etc.  Eat a healthy diet- Eat a variety of healthy foods such as fruits, vegetables, whole grains, low fat milk, low fat cheeses, yogurt, lean meats, chicken, fish, eggs, dried beans, tofu, etc.  For more information go to www.thenutritionsource.org  Dental visit- Brush and floss teeth twice daily; visit your dentist twice a year.  Eye exam- Visit your Optometrist or Ophthalmologist yearly.  Drink alcohol in moderation- Limit alcohol intake to one drink or less a day.  Never drink and drive.  Depression- Your emotional health is as important as your physical  health.  If you're feeling down or losing interest in things you normally enjoy, please talk to your healthcare provider.  Seat Belts- can save your life; always wear one  Smoke/Carbon Monoxide detectors- These detectors need to be installed on the appropriate level of your home.  Replace batteries at least once a year.  Violence- If anyone is threatening or hurting you, please tell your healthcare provider.  Living Will/ Health care power of attorney- Discuss with your healthcare provider and family.

## 2018-10-14 ENCOUNTER — Other Ambulatory Visit: Payer: Self-pay

## 2018-10-14 DIAGNOSIS — Z0000 Encounter for general adult medical examination without abnormal findings: Secondary | ICD-10-CM

## 2018-10-14 DIAGNOSIS — E782 Mixed hyperlipidemia: Secondary | ICD-10-CM

## 2018-11-04 ENCOUNTER — Other Ambulatory Visit: Payer: Self-pay

## 2018-11-04 ENCOUNTER — Ambulatory Visit (INDEPENDENT_AMBULATORY_CARE_PROVIDER_SITE_OTHER): Payer: Medicare HMO | Admitting: Nurse Practitioner

## 2018-11-04 ENCOUNTER — Encounter: Payer: Self-pay | Admitting: Nurse Practitioner

## 2018-11-04 DIAGNOSIS — Z20822 Contact with and (suspected) exposure to covid-19: Secondary | ICD-10-CM

## 2018-11-04 DIAGNOSIS — Z20828 Contact with and (suspected) exposure to other viral communicable diseases: Secondary | ICD-10-CM

## 2018-11-04 DIAGNOSIS — R6889 Other general symptoms and signs: Secondary | ICD-10-CM | POA: Diagnosis not present

## 2018-11-04 NOTE — Progress Notes (Signed)
This service is provided via telemedicine  No vital signs collected/recorded due to the encounter was a telemedicine visit.   Location of patient (ex: home, work):  Home  Patient consents to a telephone visit: Yes  Location of the provider (ex: office, home):  [facility], Office   Name of any referring provider:  N/A  Names of all persons participating in the telemedicine service and their role in the encounter:  Aaron Adkins, Aaron Mustache, NP, and Patient   Time spent on call: 3 min with medical assistant     Careteam: Patient Care Team: Aaron Chandler, NP as PCP - General (Geriatric Medicine) Aaron Seal, MD as Attending Physician (Urology)  Advanced Directive information    No Known Allergies  Chief Complaint  Patient presents with  . Acute Visit    Possible covid exposures, requesting testing.      HPI: Patient is a 63 y.o. male via televisit Works at [employer] and there was a deep clearning His job is not giving him much information.  Been about 1 week since the deep cleaning was order and he had to do this. He suspects the deep clean is due to someone having COVID and he was possible around this person because he works with others.  Denies cough, fever, chills, loss of taste or smell, no sore throat. Some congestion which is new.  Not aware of any sick contact.  Always was wearing mask while in the school.  adamant about being tested.   Review of Systems:  Review of Systems  Constitutional: Negative for chills, fever, malaise/fatigue and weight loss.  HENT: Positive for congestion. Negative for hearing loss, sinus pain and tinnitus.   Respiratory: Negative for cough, sputum production, shortness of breath and wheezing.   Cardiovascular: Negative for chest pain and leg swelling.  Gastrointestinal: Negative for abdominal pain, constipation and diarrhea.    Past Medical History:  Diagnosis Date  . Arthritis   . Chest  pain 08/28/2016   had pleurisy  . Dyslipidemia   . Electrocardiogram suggestive of ST elevation myocardial infarction (STEMI) ([place]) 08/28/2016  . Elevated PSA    03/11/16 PSA of 98.0  . Essential hypertension   . Fatigue   . Hematuria    due to prostate  . Other visual disturbances   . Overactive bladder   . Pericarditis    a. diagnosed in 07/2016 - cath showed no angiographic evidence of CAD.   Marland Kitchen Pneumothorax 1986  . Prostate cancer ([place])   . Tobacco abuse   . Urinary urgency   . Vitamin D deficiency    Past Surgical History:  Procedure Laterality Date  . DENTAL SURGERY     all teeth removed/ has complete denture  . LEFT HEART CATH AND CORONARY ANGIOGRAPHY N/A 08/28/2016   Procedure: Left Heart Cath and Coronary Angiography;  Surgeon: Burnell Blanks, MD;  Location: [facility];  Service: Cardiovascular;  Laterality: N/A;  . PROSTATE BIOPSY    . WISDOM TOOTH EXTRACTION     Social History:   reports that he quit smoking about 3 weeks ago. His smoking use included cigarettes. He has a 8.75 pack-year smoking history. He has never used smokeless tobacco. He reports current alcohol use. He reports current drug use. Frequency: 2.00 times per week. Drug: Marijuana.  Family History  Problem Relation Age of Onset  . Hypertension Mother   . Arthritis Mother   . Stroke Father   . Heart  disease Father   . Diabetes Sister   . Cancer Maternal Grandfather        prostate     Medications: Patient's Medications  New Prescriptions   No medications on file  Previous Medications   ASCORBIC ACID (VITAMIN C) 1000 MG TABLET    Take 1,000 mg by mouth daily. Takes two tablets per day   MULTIPLE VITAMINS-MINERALS (CENTRUM PO)    Take 1 tablet by mouth daily.  Modified Medications   No medications on file  Discontinued Medications   SILDENAFIL (REVATIO) 20 MG TABLET    TAKE 1 TO 5 TABLETS BY MOUTH AS NEEDED    Physical Exam:  There were no vitals filed for this visit.  There is no height or weight on file to calculate BMI. Wt Readings from Last 3 Encounters:  10/13/18 232 lb 9.6 oz (105.5 kg)  10/13/18 232 lb (105.2 kg)  03/24/18 234 lb (106.1 kg)     Labs reviewed: Basic Metabolic Panel: Recent Labs    03/24/18 0914 10/13/18 0955  NA 140 138  K 4.4 4.3  CL 106 107  CO2 26 23  GLUCOSE 87 82  BUN 9 11  CREATININE 0.80 0.57*  CALCIUM 9.8 9.9   Liver Function Tests: Recent Labs    03/24/18 0914 10/13/18 0955  AST 32 26  ALT 36 17  BILITOT 0.7 0.8  PROT 7.4 7.7   No results for input(s): LIPASE, AMYLASE in the last 8760 hours. No results for input(s): AMMONIA in the last 8760 hours. CBC: Recent Labs    03/24/18 0914 10/13/18 0955  WBC 5.2 5.6  NEUTROABS 2,943 3,349  HGB 13.9 13.6  HCT 39.7 39.5  MCV 95.0 91.9  PLT 292 301   Lipid Panel: Recent Labs    03/24/18 0914 10/13/18 0955  CHOL 192 211*  HDL 49 48  LDLCALC 121* 142*  TRIG 109 97  CHOLHDL 3.9 4.4   TSH: No results for input(s): TSH in the last 8760 hours. A1C: Lab Results  Component Value Date   HGBA1C 5.5 03/11/2016     Assessment/Plan 1. Exposure to Covid-19 Virus POSSIBLE exposure and adamant about being tested -information given on COVID and symptoms.  -directed to [facility] site to be tested. To stay in quarantine until test results are back. Pt agrees.  -to call if symptoms occur or congestion worsen.   Next appt: 04/18/2019, sooner if needed Aaron Adkins. Aaron Adkins  [facility] [phone]    Virtual Visit via Telephone Note  I connected with pt on 11/04/18 at 10:30 AM EDT by telephone and verified that I am speaking with the correct person using two identifiers.  Location: Patient: home Provider: office   I discussed the limitations, risks, security and privacy concerns of performing an evaluation and management service by telephone and the availability of in person appointments. I also discussed with  the patient that there may be a patient responsible charge related to this service. The patient expressed understanding and agreed to proceed.   I discussed the assessment and treatment plan with the patient. The patient was provided an opportunity to ask questions and all were answered. The patient agreed with the plan and demonstrated an understanding of the instructions.   The patient was advised to call back or seek an in-person evaluation if the symptoms worsen or if the condition fails to improve as anticipated.  I provided 7 minutes of non-face-to-face time during this encounter.  Janett Billow  Beaulah Corin, AGNP Avs printed and mailed

## 2018-11-04 NOTE — Patient Instructions (Signed)
This information is directly available on the CDC website: https://www.cdc.gov/coronavirus/2019-ncov/if-you-are-sick/steps-when-sick.html    Source:CDC Reference to specific commercial products, manufacturers, companies, or trademarks does not constitute its endorsement or recommendation by the U.S. Government, Department of Health and Human Services, or Centers for Disease Control and Prevention.  

## 2018-11-06 LAB — NOVEL CORONAVIRUS, NAA: SARS-CoV-2, NAA: NOT DETECTED

## 2018-12-07 DIAGNOSIS — C61 Malignant neoplasm of prostate: Secondary | ICD-10-CM | POA: Diagnosis not present

## 2018-12-07 DIAGNOSIS — N522 Drug-induced erectile dysfunction: Secondary | ICD-10-CM | POA: Diagnosis not present

## 2018-12-15 ENCOUNTER — Encounter: Payer: Self-pay | Admitting: General Practice

## 2018-12-15 NOTE — Progress Notes (Signed)
[place] CSW Progress Notes  Request from chaplain Lorrin Jackson to reach out to patient regarding any available resources for financial assistance with Lupron cost.  Spoke w patient - he has received additional health from [facility] and is not in need of help w medications.  However, income has been reduced due to leaving his part time job which supplemented his Social Security income.  Cannot perform duties like he used to due to medical issues - will be on medical leave.  Wondered if there are grants available to him.  Referred to [employer] at [phone]) and Cancer Care.  Referral letter sent to [facility], email to patient w contact details.  Edwyna Shell, LCSW Clinical Social Worker Phone:  [phone] Cell:  [phone]

## 2018-12-16 ENCOUNTER — Encounter: Payer: Self-pay | Admitting: General Practice

## 2018-12-16 NOTE — Progress Notes (Signed)
[place] CSW Progress Notes  Call to patient to convey requirements for financial assistance at [facility].  Patient aware of documents he needs to bring, including doctors note re his current inability to work. Patient also aware of how to request help from [place] and advised that he will need to submit his part of the application to [place] for processing prior to submission.  Edwyna Shell, LCSW Clinical Social Worker Phone:  [phone]

## 2018-12-20 ENCOUNTER — Ambulatory Visit: Payer: Medicare HMO | Admitting: Orthopaedic Surgery

## 2018-12-21 ENCOUNTER — Ambulatory Visit: Payer: Medicare HMO | Admitting: Orthopaedic Surgery

## 2018-12-22 DIAGNOSIS — Z833 Family history of diabetes mellitus: Secondary | ICD-10-CM | POA: Diagnosis not present

## 2018-12-22 DIAGNOSIS — Z008 Encounter for other general examination: Secondary | ICD-10-CM | POA: Diagnosis not present

## 2018-12-22 DIAGNOSIS — R69 Illness, unspecified: Secondary | ICD-10-CM | POA: Diagnosis not present

## 2018-12-22 DIAGNOSIS — R32 Unspecified urinary incontinence: Secondary | ICD-10-CM | POA: Diagnosis not present

## 2018-12-22 DIAGNOSIS — Z7982 Long term (current) use of aspirin: Secondary | ICD-10-CM | POA: Diagnosis not present

## 2018-12-22 DIAGNOSIS — Z791 Long term (current) use of non-steroidal anti-inflammatories (NSAID): Secondary | ICD-10-CM | POA: Diagnosis not present

## 2018-12-22 DIAGNOSIS — R61 Generalized hyperhidrosis: Secondary | ICD-10-CM | POA: Diagnosis not present

## 2018-12-22 DIAGNOSIS — C61 Malignant neoplasm of prostate: Secondary | ICD-10-CM | POA: Diagnosis not present

## 2018-12-23 DIAGNOSIS — R69 Illness, unspecified: Secondary | ICD-10-CM | POA: Diagnosis not present

## 2018-12-29 ENCOUNTER — Ambulatory Visit: Payer: Medicare HMO | Admitting: Orthopaedic Surgery

## 2018-12-30 ENCOUNTER — Telehealth: Payer: Self-pay

## 2018-12-30 NOTE — Telephone Encounter (Signed)
Spoke with patient, patient plans to complete within the next week

## 2018-12-30 NOTE — Telephone Encounter (Signed)
-----   Message from [name], NP sent at 12/29/2018  3:06 PM EDT ----- Regarding: cologuard reminder Will you call or send a patient reminder to complete the cologuard

## 2018-12-30 NOTE — Telephone Encounter (Signed)
Left message on voicemail for patient to return call when available   

## 2019-01-05 ENCOUNTER — Ambulatory Visit: Payer: Medicare HMO | Admitting: Orthopaedic Surgery

## 2019-03-17 DIAGNOSIS — C61 Malignant neoplasm of prostate: Secondary | ICD-10-CM | POA: Diagnosis not present

## 2019-03-23 ENCOUNTER — Telehealth: Payer: Self-pay

## 2019-03-23 NOTE — Telephone Encounter (Signed)
Has he seen a GI doctor? If cologuard was positive he needs follow up with GI for further evaluation. Also if this is not in epic please print copy so we can abstract

## 2019-03-23 NOTE — Telephone Encounter (Signed)
Called patient to inquire about cologuard kit that was sent out to him on 10/13/2018. Patient states his kit has already been sent in and he got results. His results came back positive.   Routing to provider to inform.

## 2019-03-24 DIAGNOSIS — C61 Malignant neoplasm of prostate: Secondary | ICD-10-CM | POA: Diagnosis not present

## 2019-03-24 DIAGNOSIS — Z5111 Encounter for antineoplastic chemotherapy: Secondary | ICD-10-CM | POA: Diagnosis not present

## 2019-04-10 NOTE — Telephone Encounter (Signed)
Patient did state he got results from GI and he had an appointment for follow up. Results are not available in epic.

## 2019-04-11 ENCOUNTER — Telehealth: Payer: Self-pay | Admitting: *Deleted

## 2019-04-11 DIAGNOSIS — Z711 Person with feared health complaint in whom no diagnosis is made: Secondary | ICD-10-CM

## 2019-04-11 NOTE — Telephone Encounter (Signed)
Pt calling stating he has a scratch in his eye and would like a referral to the eye dr. I advised he may or may not need one. He will call his [place] medicare to see if he has vision coverage and call us back.

## 2019-04-11 NOTE — Telephone Encounter (Signed)
Pt called and spoke with [place] and he will need a referral to a eye dr, please advise

## 2019-04-11 NOTE — Telephone Encounter (Signed)
Referral send for ophthalmologist, I have CC Lattie Haw in so she can go ahead and start working on referral.

## 2019-04-11 NOTE — Telephone Encounter (Signed)
Pt calling asking what is taking so long with the referral, I explained to pt this is not a quick process. I had asked the pt to check with his insurance to see what office is covered, he forgot to ask. Pt states he may just go to urgent care, I explained we don't have the equipment to look in his eye.

## 2019-04-11 NOTE — Telephone Encounter (Signed)
Referral has been sent as urgent to [facility]. I have contacted pt & provided # to [facility] office for scheduling info.  Thanks, Lattie Haw

## 2019-04-11 NOTE — Telephone Encounter (Signed)
Thank you!  Aaron Adkins

## 2019-04-12 ENCOUNTER — Ambulatory Visit: Payer: Medicare HMO | Admitting: Nurse Practitioner

## 2019-04-12 DIAGNOSIS — H209 Unspecified iridocyclitis: Secondary | ICD-10-CM | POA: Diagnosis not present

## 2019-04-16 ENCOUNTER — Encounter (HOSPITAL_COMMUNITY): Payer: Self-pay

## 2019-04-16 ENCOUNTER — Other Ambulatory Visit: Payer: Self-pay

## 2019-04-16 ENCOUNTER — Ambulatory Visit (HOSPITAL_COMMUNITY)
Admission: EM | Admit: 2019-04-16 | Discharge: 2019-04-16 | Disposition: A | Discharge status: Home / Self Care | Payer: Medicare HMO | Attending: Emergency Medicine | Admitting: Emergency Medicine

## 2019-04-16 DIAGNOSIS — K115 Sialolithiasis: Secondary | ICD-10-CM | POA: Diagnosis not present

## 2019-04-16 DIAGNOSIS — R609 Edema, unspecified: Secondary | ICD-10-CM

## 2019-04-16 MED ORDER — IBUPROFEN 600 MG PO TABS: 600.0000 mg | ORAL_TABLET | Freq: Four times a day (QID) | ORAL | 0 refills | Status: AC | PRN

## 2019-04-16 MED ORDER — AMOXICILLIN-POT CLAVULANATE 875-125 MG PO TABS: 1.0000 | ORAL_TABLET | Freq: Two times a day (BID) | ORAL | 0 refills | Status: AC

## 2019-04-16 MED ORDER — FLUORESCEIN SODIUM 1 MG OP STRP
ORAL_STRIP | OPHTHALMIC | Status: AC
  Filled 2019-04-16: qty 1

## 2019-04-16 NOTE — Discharge Instructions (Addendum)
Warm compresses, stay very well-hydrated.  You can try sour candy such as lemon drops or heads or sour patch kids to help eject this time.  Finish the antibiotics, even if you feel better.  Continue warm compresses.  May take 600 mg ibuprofen combined with 1000 mg of Tylenol 3-4 times a day as needed for pain.  Follow-up with [facility] ear nose and throat if you are not better with this therapy in a week for reevaluation and further imaging.

## 2019-04-16 NOTE — ED Triage Notes (Signed)
Patient presents to Urgent Care with complaints of dental pain and swelling since about 3 weeks ago. Patient reports the swelling first spread to his eye and he got that fixed, then the swelling spread to the left side of his neck, which is bothering him today.

## 2019-04-16 NOTE — ED Provider Notes (Signed)
HPI  SUBJECTIVE:  Aaron Schnur. is a 64 y.o. male who presents with 3 to 4 weeks of swelling inferior to his left jaw.  States that it gets bigger and harder before eating, smaller with fasting.  He reports intermittent throbbing pain with eating, chewing.  States that it is not tender to palpation.  No preceding trauma.  No fevers, drooling, trismus, neck stiffness.  He has tried warm compresses, Tylenol ibuprofen without improvement in symptoms.  Symptoms are better with fasting, worse with eating/salivation.  He has never had symptoms like this before.  No antipyretic in the past 4 to 6 hours.  He wears complete upper and lower dentures. Patient with a past medical history of prostate cancer, STEMI, he smokes 2 cigarettes a day.  No history of diabetes, antihistamine use, hypertension, Sjogren's syndrome.  PMD: [name], NP   Past Medical History:  Diagnosis Date  . Arthritis   . Chest pain 08/28/2016   had pleurisy  . Dyslipidemia   . Electrocardiogram suggestive of ST elevation myocardial infarction (STEMI) ([place]) 08/28/2016  . Elevated PSA    03/11/16 PSA of 98.0  . Essential hypertension   . Fatigue   . Hematuria    due to prostate  . Other visual disturbances   . Overactive bladder   . Pericarditis    a. diagnosed in 07/2016 - cath showed no angiographic evidence of CAD.   Marland Kitchen Pneumothorax 1986  . Prostate cancer ([place])   . Tobacco abuse   . Urinary urgency   . Vitamin D deficiency     Past Surgical History:  Procedure Laterality Date  . DENTAL SURGERY     all teeth removed/ has complete denture  . LEFT HEART CATH AND CORONARY ANGIOGRAPHY N/A 08/28/2016   Procedure: Left Heart Cath and Coronary Angiography;  Surgeon: Burnell Blanks, MD;  Location: [facility];  Service: Cardiovascular;  Laterality: N/A;  . PROSTATE BIOPSY    . WISDOM TOOTH EXTRACTION      Family History  Problem Relation Age of Onset  . Hypertension Mother   . Arthritis  Mother   . Stroke Father   . Heart disease Father   . Diabetes Sister   . Cancer Maternal Grandfather        prostate     Social History   Tobacco Use  . Smoking status: Former Smoker    Packs/day: 0.25    Years: 35.00    Pack years: 8.75    Types: Cigarettes    Quit date: 10/10/2018    Years since quitting: 0.5  . Smokeless tobacco: Never Used  . Tobacco comment: smoke about 3 cigarettes daily  Substance Use Topics  . Alcohol use: Yes    Comment: seldomly- holidays/birthdays  . Drug use: Yes    Frequency: 2.0 times per week    Types: Marijuana    Comment: smokes marijuana 2-3 times a week to help with pain/stiffness in the mornings.     No current facility-administered medications for this encounter.  Current Outpatient Medications:  .  amoxicillin-clavulanate (AUGMENTIN) 875-125 MG tablet, Take 1 tablet by mouth 2 (two) times daily for 7 days., Disp: 14 tablet, Rfl: 0 .  Ascorbic Acid (VITAMIN C) 1000 MG tablet, Take 1,000 mg by mouth daily. Takes two tablets per day, Disp: , Rfl:  .  ibuprofen (ADVIL) 600 MG tablet, Take 1 tablet (600 mg total) by mouth every 6 (six) hours as needed., Disp: 30 tablet, Rfl: 0 .  Multiple Vitamins-Minerals (CENTRUM PO), Take 1 tablet by mouth daily., Disp: , Rfl:  .  prednisoLONE acetate (PRED FORTE) 1 % ophthalmic suspension, SMARTSIG:1 Drop(s) Left Eye Every 2 Hours, Disp: , Rfl:  .  tobramycin (TOBREX) 0.3 % ophthalmic solution, Place 1 drop into the left eye 4 (four) times daily., Disp: , Rfl:   No Known Allergies   ROS  As noted in HPI.   Physical Exam  BP (!) 140/94 (BP Location: Right Arm)   Pulse (!) 115 Comment: patient talking constantly and moving around  Temp 99.3 F (37.4 C) (Oral)   Resp 17   SpO2 100%   Constitutional: Well developed, well nourished, no acute distress Eyes:  EOMI, conjunctiva normal bilaterally HENT: Normocephalic, atraumatic,mucus membranes moist.  No tenderness or appreciable swelling along  the parotid gland.  5 x 6 cm tender mass at the angle of the left jaw suggestive of submandibular gland swelling.  No overlying erythema, increased temperature.  No apparent fluctuance.  No expressible purulent drainage from Stensen's or Wharton's duct.  No swelling underneath the tongue.  No trismus.  Gums nontender, no erythema, expressible purulent drainage, no swelling or evidence of trauma.      Lymph: No posterior or anterior surrounding cervical lymphadenopathy.  Respiratory: Normal inspiratory effort Cardiovascular: Normal rate GI: nondistended skin: No rash, skin intact Musculoskeletal: no deformities Neurologic: Alert & oriented x 3, no focal neuro deficits Psychiatric: Speech and behavior appropriate   ED Course   Medications - No data to display  No orders of the defined types were placed in this encounter.   No results found for this or any previous visit (from the past 24 hour(s)). No results found.  ED Clinical Impression  1. Submandibular gland swelling   2. Salivary gland stone      ED Assessment/Plan  Suspect submandibular salivary gland stone, although it is very posterior.  No swelling, tenderness of the parotid gland.  However given the fact that it gets bigger and smaller with salivating/fasting, do not think that this is a cervical lymphadenopathy.  Could also be salivary gland tumor however this is tender to palpation.  Does not appear to be a Ludwig angina.  Does not appear to be from a dental infection.  We will have him stay well-hydrated, continue warm compresses, massage the gland, sialagogues such as lemon drops or sour patch kids, ibuprofen as needed.  Given that he has had this for 3 weeks, sending home on Augmentin for 7 days due to possibility of secondary bacterial infection.  We will have him follow-up with ENT if he does not get better in a week with this for definitive diagnosis and further management.  [facility] ENT on-call.  Discussed l  MDM, treatment plan, and plan for follow-up with patient. Discussed sn/sx that should prompt return to the ED. patient agrees with plan.   Meds ordered this encounter  Medications  . ibuprofen (ADVIL) 600 MG tablet    Sig: Take 1 tablet (600 mg total) by mouth every 6 (six) hours as needed.    Dispense:  30 tablet    Refill:  0  . amoxicillin-clavulanate (AUGMENTIN) 875-125 MG tablet    Sig: Take 1 tablet by mouth 2 (two) times daily for 7 days.    Dispense:  14 tablet    Refill:  0    *This clinic note was created using [employer]. Therefore, there may be occasional mistakes despite careful proofreading.   ?  Melynda Ripple, MD 04/17/19 [phone]

## 2019-04-18 ENCOUNTER — Ambulatory Visit: Payer: Medicare HMO | Admitting: Nurse Practitioner

## 2019-04-19 ENCOUNTER — Ambulatory Visit (INDEPENDENT_AMBULATORY_CARE_PROVIDER_SITE_OTHER): Payer: Medicare HMO | Admitting: Nurse Practitioner

## 2019-04-19 ENCOUNTER — Other Ambulatory Visit: Payer: Self-pay

## 2019-04-19 ENCOUNTER — Encounter: Payer: Self-pay | Admitting: Nurse Practitioner

## 2019-04-19 VITALS — BP 128/80 | HR 79 | Temp 96.9°F | Ht 75.0 in | Wt 237.0 lb

## 2019-04-19 DIAGNOSIS — R195 Other fecal abnormalities: Secondary | ICD-10-CM | POA: Diagnosis not present

## 2019-04-19 DIAGNOSIS — M1990 Unspecified osteoarthritis, unspecified site: Secondary | ICD-10-CM | POA: Diagnosis not present

## 2019-04-19 DIAGNOSIS — C61 Malignant neoplasm of prostate: Secondary | ICD-10-CM

## 2019-04-19 DIAGNOSIS — E782 Mixed hyperlipidemia: Secondary | ICD-10-CM

## 2019-04-19 DIAGNOSIS — Z0000 Encounter for general adult medical examination without abnormal findings: Secondary | ICD-10-CM | POA: Diagnosis not present

## 2019-04-19 DIAGNOSIS — R609 Edema, unspecified: Secondary | ICD-10-CM

## 2019-04-19 LAB — LIPID PANEL
Cholesterol: 161 mg/dL (ref <200)
HDL: 48 mg/dL (ref >=40)
LDL Cholesterol (Calc): 97 mg/dL (calc)
Non-HDL Cholesterol (Calc): 113 mg/dL (calc) (ref <130)
Total CHOL/HDL Ratio: 3.4 (calc) (ref <5.0)
Triglycerides: 71 mg/dL (ref <150)

## 2019-04-19 LAB — COMPLETE METABOLIC PANEL WITH GFR
AG Ratio: 1.3 (calc) (ref 1.0–2.5)
ALT: 10 U/L (ref 9–46)
AST: 17 U/L (ref 10–35)
Albumin: 4 g/dL (ref 3.6–5.1)
Alkaline phosphatase (APISO): 85 U/L (ref 35–144)
BUN/Creatinine Ratio: 11 (calc) (ref 6–22)
BUN: 7 mg/dL (ref 7–25)
CO2: 23 mmol/L (ref 20–32)
Calcium: 9.3 mg/dL (ref 8.6–10.3)
Chloride: 109 mmol/L (ref 98–110)
Creat: 0.63 mg/dL — ABNORMAL LOW (ref 0.70–1.25)
GFR, Est African American: 122 mL/min/{1.73_m2} (ref >=60)
GFR, Est Non African American: 105 mL/min/{1.73_m2} (ref >=60)
Globulin: 3.2 g/dL (calc) (ref 1.9–3.7)
Glucose, Bld: 85 mg/dL (ref 65–99)
Potassium: 4.1 mmol/L (ref 3.5–5.3)
Sodium: 140 mmol/L (ref 135–146)
Total Bilirubin: 0.5 mg/dL (ref 0.2–1.2)
Total Protein: 7.2 g/dL (ref 6.1–8.1)

## 2019-04-19 NOTE — Patient Instructions (Signed)
Heart-Healthy Eating Plan Many factors influence your heart (coronary) health, including eating and exercise habits. Coronary risk increases with abnormal blood fat (lipid) levels. Heart-healthy meal planning includes limiting unhealthy fats, increasing healthy fats, and making other diet and lifestyle changes.  What are tips for following this plan? Cooking Cook foods using methods other than frying. Baking, boiling, grilling, and broiling are all good options. Other ways to reduce fat include:  Removing the skin from poultry.  Removing all visible fats from meats.  Steaming vegetables in water or broth. Meal planning   At meals, imagine dividing your plate into fourths: ? Fill one-half of your plate with vegetables and green salads. ? Fill one-fourth of your plate with whole grains. ? Fill one-fourth of your plate with lean protein foods.  Eat 4-5 servings of vegetables per day. One serving equals 1 cup raw or cooked vegetable, or 2 cups raw leafy greens.  Eat 4-5 servings of fruit per day. One serving equals 1 medium whole fruit,  cup dried fruit,  cup fresh, frozen, or canned fruit, or  cup 100% fruit juice.  Eat more foods that contain soluble fiber. Examples include apples, broccoli, carrots, beans, peas, and barley. Aim to get 25-30 g of fiber per day.  Increase your consumption of legumes, nuts, and seeds to 4-5 servings per week. One serving of dried beans or legumes equals  cup cooked, 1 serving of nuts is  cup, and 1 serving of seeds equals 1 tablespoon. Fats  Choose healthy fats more often. Choose monounsaturated and polyunsaturated fats, such as olive and canola oils, flaxseeds, walnuts, almonds, and seeds.  Eat more omega-3 fats. Choose salmon, mackerel, sardines, tuna, flaxseed oil, and ground flaxseeds. Aim to eat fish at least 2 times each week.  Check food labels carefully to identify foods with trans fats or high amounts of saturated fat.  Limit  saturated fats. These are found in animal products, such as meats, butter, and cream. Plant sources of saturated fats include palm oil, palm kernel oil, and coconut oil.  Avoid foods with partially hydrogenated oils in them. These contain trans fats. Examples are stick margarine, some tub margarines, cookies, crackers, and other baked goods.  Avoid fried foods. General information  Eat more home-cooked food and less restaurant, buffet, and fast food.  Limit or avoid alcohol.  Limit foods that are high in starch and sugar.  Lose weight if you are overweight. Losing just 5-10% of your body weight can help your overall health and prevent diseases such as diabetes and heart disease.  Monitor your salt (sodium) intake, especially if you have high blood pressure. Talk with your health care provider about your sodium intake.  Try to incorporate more vegetarian meals weekly. What foods can I eat? Fruits All fresh, canned (in natural juice), or frozen fruits. Vegetables Fresh or frozen vegetables (raw, steamed, roasted, or grilled). Green salads. Grains Most grains. Choose whole wheat and whole grains most of the time. Rice and pasta, including brown rice and pastas made with whole wheat. Meats and other proteins Lean, well-trimmed beef, veal, pork, and lamb. Chicken and [place] without skin. All fish and shellfish. Wild duck, rabbit, pheasant, and venison. Egg whites or low-cholesterol egg substitutes. Dried beans, peas, lentils, and tofu. Seeds and most nuts. Dairy Low-fat or nonfat cheeses, including ricotta and mozzarella. Skim or 1% milk (liquid, powdered, or evaporated). Buttermilk made with low-fat milk. Nonfat or low-fat yogurt. Fats and oils Non-hydrogenated (trans-free) margarines. Vegetable oils,  including soybean, sesame, sunflower, olive, peanut, safflower, corn, canola, and cottonseed. Salad dressings or mayonnaise made with a vegetable oil. Beverages Water (mineral or sparkling).  Coffee and tea. Diet carbonated beverages. Sweets and desserts Sherbet, gelatin, and fruit ice. Small amounts of dark chocolate. Limit all sweets and desserts. Seasonings and condiments All seasonings and condiments. The items listed above may not be a complete list of foods and beverages you can eat. Contact a dietitian for more options. What foods are not recommended? Fruits Canned fruit in heavy syrup. Fruit in cream or butter sauce. Fried fruit. Limit coconut. Vegetables Vegetables cooked in cheese, cream, or butter sauce. Fried vegetables. Grains Breads made with saturated or trans fats, oils, or whole milk. Croissants. Sweet rolls. Donuts. High-fat crackers, such as cheese crackers. Meats and other proteins Fatty meats, such as hot dogs, ribs, sausage, bacon, rib-eye roast or steak. High-fat deli meats, such as salami and bologna. Caviar. Domestic duck and goose. Organ meats, such as liver. Dairy Cream, sour cream, cream cheese, and creamed cottage cheese. Whole milk cheeses. Whole or 2% milk (liquid, evaporated, or condensed). Whole buttermilk. Cream sauce or high-fat cheese sauce. Whole-milk yogurt. Fats and oils Meat fat, or shortening. Cocoa butter, hydrogenated oils, palm oil, coconut oil, palm kernel oil. Solid fats and shortenings, including bacon fat, salt pork, lard, and butter. Nondairy cream substitutes. Salad dressings with cheese or sour cream. Beverages Regular sodas and any drinks with added sugar. Sweets and desserts Frosting. Pudding. Cookies. Cakes. Pies. Milk chocolate or white chocolate. Buttered syrups. Full-fat ice cream or ice cream drinks. The items listed above may not be a complete list of foods and beverages to avoid. Contact a dietitian for more information. Summary  Heart-healthy meal planning includes limiting unhealthy fats, increasing healthy fats, and making other diet and lifestyle changes.  Lose weight if you are overweight. Losing just 5-10% of  your body weight can help your overall health and prevent diseases such as diabetes and heart disease.  Focus on eating a balance of foods, including fruits and vegetables, low-fat or nonfat dairy, lean protein, nuts and legumes, whole grains, and heart-healthy oils and fats. This information is not intended to replace advice given to you by your health care provider. Make sure you discuss any questions you have with your health care provider. Document Revised: 03/26/2017 Document Reviewed: 03/26/2017 Elsevier Patient Education  2020 [employer]. Steps to Quit Smoking Smoking tobacco is the leading cause of preventable death. It can affect almost every organ in the body. Smoking puts you and those around you at risk for developing many serious chronic diseases. Quitting smoking can be difficult, but it is one of the best things that you can do for your health. It is never too late to quit. How do I get ready to quit? When you decide to quit smoking, create a plan to help you succeed. Before you quit:  Pick a date to quit. Set a date within the next 2 weeks to give you time to prepare.  Write down the reasons why you are quitting. Keep this list in places where you will see it often.  Tell your family, friends, and co-workers that you are quitting. Support from your loved ones can make quitting easier.  Talk with your health care provider about your options for quitting smoking.  Find out what treatment options are covered by your health insurance.  Identify people, places, things, and activities that make you want to smoke (triggers). Avoid them. What  first steps can I take to quit smoking?  Throw away all cigarettes at home, at work, and in your car.  Throw away smoking accessories, such as Scientist, research (medical).  Clean your car. Make sure to empty the ashtray.  Clean your home, including curtains and carpets. What strategies can I use to quit smoking? Talk with your health care  provider about combining strategies, such as taking medicines while you are also receiving in-person counseling. Using these two strategies together makes you more likely to succeed in quitting than if you used either strategy on its own.  If you are pregnant or breastfeeding, talk with your health care provider about finding counseling or other support strategies to quit smoking. Do not take medicine to help you quit smoking unless your health care provider tells you to do so. To quit smoking: Quit right away  Quit smoking completely, instead of gradually reducing how much you smoke over a period of time. Research shows that stopping smoking right away is more successful than gradually quitting.  Attend in-person counseling to help you build problem-solving skills. You are more likely to succeed in quitting if you attend counseling sessions regularly. Even short sessions of 10 minutes can be effective. Take medicine You may take medicines to help you quit smoking. Some medicines require a prescription and some you can purchase over-the-counter. Medicines may have nicotine in them to replace the nicotine in cigarettes. Medicines may:  Help to stop cravings.  Help to relieve withdrawal symptoms. Your health care provider may recommend:  Nicotine patches, gum, or lozenges.  Nicotine inhalers or sprays.  Non-nicotine medicine that is taken by mouth. Find resources Find resources and support systems that can help you to quit smoking and remain smoke-free after you quit. These resources are most helpful when you use them often. They include:  Online chats with a Social worker.  Telephone quitlines.  Printed Furniture conservator/restorer.  Support groups or group counseling.  Text messaging programs.  Mobile phone apps or applications. Use apps that can help you stick to your quit plan by providing reminders, tips, and encouragement. There are many free apps for mobile devices as well as websites.  Examples include Quit Guide from the [employer] and smokefree.gov What things can I do to make it easier to quit?   Reach out to your family and friends for support and encouragement. Call telephone quitlines (1-800-QUIT-NOW), reach out to support groups, or work with a counselor for support.  Ask people who smoke to avoid smoking around you.  Avoid places that trigger you to smoke, such as bars, parties, or smoke-break areas at work.  Spend time with people who do not smoke.  Lessen the stress in your life. Stress can be a smoking trigger for some people. To lessen stress, try: ? Exercising regularly. ? Doing deep-breathing exercises. ? Doing yoga. ? Meditating. ? Performing a body scan. This involves closing your eyes, scanning your body from head to toe, and noticing which parts of your body are particularly tense. Try to relax the muscles in those areas. How will I feel when I quit smoking? Day 1 to 3 weeks Within the first 24 hours of quitting smoking, you may start to feel withdrawal symptoms. These symptoms are usually most noticeable 2-3 days after quitting, but they usually do not last for more than 2-3 weeks. You may experience these symptoms:  Mood swings.  Restlessness, anxiety, or irritability.  Trouble concentrating.  Dizziness.  Strong cravings for sugary foods and  nicotine.  Mild weight gain.  Constipation.  Nausea.  Coughing or a sore throat.  Changes in how the medicines that you take for unrelated issues work in your body.  Depression.  Trouble sleeping (insomnia). Week 3 and afterward After the first 2-3 weeks of quitting, you may start to notice more positive results, such as:  Improved sense of smell and taste.  Decreased coughing and sore throat.  Slower heart rate.  Lower blood pressure.  Clearer skin.  The ability to breathe more easily.  Fewer sick days. Quitting smoking can be very challenging. Do not get discouraged if you are not  successful the first time. Some people need to make many attempts to quit before they achieve long-term success. Do your best to stick to your quit plan, and talk with your health care provider if you have any questions or concerns. Summary  Smoking tobacco is the leading cause of preventable death. Quitting smoking is one of the best things that you can do for your health.  When you decide to quit smoking, create a plan to help you succeed.  Quit smoking right away, not slowly over a period of time.  When you start quitting, seek help from your health care provider, family, or friends. This information is not intended to replace advice given to you by your health care provider. Make sure you discuss any questions you have with your health care provider. Document Revised: 11/11/2018 Document Reviewed: 05/07/2018 Elsevier Patient Education  [place].

## 2019-04-19 NOTE — Progress Notes (Signed)
Careteam: Patient Care Team: [name], NP as PCP - General (Geriatric Medicine) [name], MD as Attending Physician (Urology)  Advanced Directive information    No Known Allergies  Chief Complaint  Patient presents with  . Medical Management of Chronic Issues    6 month follow-up. Patient not fasting, had banana and medications.      HPI: Patient is a 64 y.o. male for routine follow up.   Prostate cancer- continues to follow up with urologist, getting Lupron injection, has had 3/4 injection having joint pain and fatigue from shot.   Pt with red eye, went to Dr Katy Fitch and got eye drops Then he had swelling to left jaw and went to urgent care and started on Augmentin for 7 days, reports swelling is improving.  ENT referral if not completely improved in 1 week.   Had positive cologuard- needing referral to GI.   Hyperlipidemia- did not come fasting (ate a banana). LDL elevated at last visit but did not wish to start medication at that time. Has made dietary changes. Reports he has cut back on fat.   Continues to smoke- 2-3 cigarettes a day, trying to stop but likes to smoke every now and then.   Working on exercise- 3-4 times   Review of Systems:  Review of Systems  Constitutional: Positive for malaise/fatigue. Negative for chills, fever and weight loss.  Eyes: Negative for pain, discharge and redness.  Respiratory: Negative for cough, sputum production and shortness of breath.   Cardiovascular: Negative for chest pain, palpitations and leg swelling.  Gastrointestinal: Positive for constipation (taking stool softener). Negative for abdominal pain, diarrhea and heartburn.  Genitourinary: Negative for dysuria, frequency and urgency.  Musculoskeletal: Positive for joint pain. Negative for back pain, falls and myalgias.  Skin: Negative.   Neurological: Negative for dizziness and headaches.  Psychiatric/Behavioral: Negative for depression and memory loss. The  patient does not have insomnia.     Past Medical History:  Diagnosis Date  . Arthritis   . Chest pain 08/28/2016   had pleurisy  . Dyslipidemia   . Electrocardiogram suggestive of ST elevation myocardial infarction (STEMI) ([place]) 08/28/2016  . Elevated PSA    03/11/16 PSA of 98.0  . Essential hypertension   . Fatigue   . Hematuria    due to prostate  . Other visual disturbances   . Overactive bladder   . Pericarditis    a. diagnosed in 07/2016 - cath showed no angiographic evidence of CAD.   Marland Kitchen Pneumothorax 1986  . Prostate cancer ([place])   . Tobacco abuse   . Urinary urgency   . Vitamin D deficiency    Past Surgical History:  Procedure Laterality Date  . DENTAL SURGERY     all teeth removed/ has complete denture  . LEFT HEART CATH AND CORONARY ANGIOGRAPHY N/A 08/28/2016   Procedure: Left Heart Cath and Coronary Angiography;  Surgeon: Burnell Blanks, MD;  Location: [facility];  Service: Cardiovascular;  Laterality: N/A;  . PROSTATE BIOPSY    . WISDOM TOOTH EXTRACTION     Social History:   reports that he has been smoking cigarettes. He has a 8.75 pack-year smoking history. He has never used smokeless tobacco. He reports current alcohol use. He reports current drug use. Frequency: 2.00 times per week. Drug: Marijuana.  Family History  Problem Relation Age of Onset  . Hypertension Mother   . Arthritis Mother   . Stroke Father   . Heart disease  Father   . Diabetes Sister   . Cancer Maternal Grandfather        prostate     Medications: Patient's Medications  New Prescriptions   No medications on file  Previous Medications   AMOXICILLIN-CLAVULANATE (AUGMENTIN) 875-125 MG TABLET    Take 1 tablet by mouth 2 (two) times daily for 7 days.   ASCORBIC ACID (VITAMIN C) 1000 MG TABLET    Take 1,000 mg by mouth daily.    IBUPROFEN (ADVIL) 600 MG TABLET    Take 1 tablet (600 mg total) by mouth every 6 (six) hours as needed.   MULTIPLE VITAMINS-MINERALS (CENTRUM  PO)    Take 1 tablet by mouth daily.   PREDNISOLONE ACETATE (PRED FORTE) 1 % OPHTHALMIC SUSPENSION    SMARTSIG:1 Drop(s) Left Eye Every 2 Hours   TOBRAMYCIN (TOBREX) 0.3 % OPHTHALMIC SOLUTION    Place 1 drop into the left eye 4 (four) times daily.  Modified Medications   No medications on file  Discontinued Medications   No medications on file    Physical Exam:  Vitals:   04/19/19 0950  BP: 128/80  Pulse: 79  Temp: (!) 96.9 F (36.1 C)  TempSrc: Temporal  SpO2: 97%  Weight: 237 lb (107.5 kg)  Height: '6\' 3"'  (1.905 m)   Body mass index is 29.62 kg/m. Wt Readings from Last 3 Encounters:  04/19/19 237 lb (107.5 kg)  10/13/18 232 lb 9.6 oz (105.5 kg)  10/13/18 232 lb (105.2 kg)    Physical Exam Constitutional:      General: He is not in acute distress.    Appearance: He is well-developed. He is not diaphoretic.  HENT:     Head: Normocephalic and atraumatic.     Right Ear: Tympanic membrane normal.     Left Ear: Tympanic membrane normal.     Mouth/Throat:     Mouth: Mucous membranes are moist.     Pharynx: No oropharyngeal exudate or posterior oropharyngeal erythema.  Eyes:     Conjunctiva/sclera: Conjunctivae normal.     Pupils: Pupils are equal, round, and reactive to light.  Cardiovascular:     Rate and Rhythm: Normal rate and regular rhythm.     Heart sounds: Normal heart sounds.  Pulmonary:     Effort: Pulmonary effort is normal.     Breath sounds: Normal breath sounds.  Abdominal:     General: Bowel sounds are normal.     Palpations: Abdomen is soft.  Musculoskeletal:        General: No tenderness.     Cervical back: Normal range of motion and neck supple. Edema (fullness/swelling under left jaw, has improved from 2/14) present.  Skin:    General: Skin is warm and dry.  Neurological:     Mental Status: He is alert and oriented to person, place, and time.  Psychiatric:        Mood and Affect: Mood normal.        Behavior: Behavior normal.     Labs  reviewed: Basic Metabolic Panel: Recent Labs    10/13/18 0955  NA 138  K 4.3  CL 107  CO2 23  GLUCOSE 82  BUN 11  CREATININE 0.57*  CALCIUM 9.9   Liver Function Tests: Recent Labs    10/13/18 0955  AST 26  ALT 17  BILITOT 0.8  PROT 7.7   No results for input(s): LIPASE, AMYLASE in the last 8760 hours. No results for input(s): AMMONIA in the last 8760 hours. CBC:  Recent Labs    10/13/18 0955  WBC 5.6  NEUTROABS 3,349  HGB 13.6  HCT 39.5  MCV 91.9  PLT 301   Lipid Panel: Recent Labs    10/13/18 0955  CHOL 211*  HDL 48  LDLCALC 142*  TRIG 97  CHOLHDL 4.4   TSH: No results for input(s): TSH in the last 8760 hours. A1C: Lab Results  Component Value Date   HGBA1C 5.5 03/11/2016     Assessment/Plan 1. Positive colorectal cancer screening using Cologuard test -pt reports he completed cologuard and got a positive results. I have looked into the cologuard database but unable to find this record- may have been sent to another provider.  - Ambulatory referral to Gastroenterology  2. Malignant neoplasm of prostate ([place]) -continues to follow up with urologist, recently with lupron injection causing body ache and worsening joint pains.   3. Mixed hyperlipidemia -did not start medication, will follow up lipids at this time. Working on diet and exercise.  - Lipid Panel - CMP with eGFR([facility])  4. Osteoarthritis, unspecified osteoarthritis type, unspecified site Improved with exercises.   5. Submandibular gland swelling Has improved, will notify if swelling has not completely resolved after completing antibiotic.   Next appt:  6 months.  Aaron Adkins. [place] [phone]

## 2019-05-04 ENCOUNTER — Ambulatory Visit: Payer: Medicare HMO | Admitting: Nurse Practitioner

## 2019-05-04 ENCOUNTER — Encounter: Payer: Self-pay | Admitting: Nurse Practitioner

## 2019-05-04 VITALS — BP 120/96 | HR 103 | Temp 98.4°F | Ht 74.5 in | Wt 235.0 lb

## 2019-05-04 DIAGNOSIS — R195 Other fecal abnormalities: Secondary | ICD-10-CM

## 2019-05-04 DIAGNOSIS — Z01818 Encounter for other preprocedural examination: Secondary | ICD-10-CM

## 2019-05-04 DIAGNOSIS — K625 Hemorrhage of anus and rectum: Secondary | ICD-10-CM

## 2019-05-04 NOTE — Patient Instructions (Signed)
If you are age 64 or older, your body mass index should be between 23-30. Your Body mass index is 29.77 kg/m. If this is out of the aforementioned range listed, please consider follow up with your Primary Care Provider.  If you are age 97 or younger, your body mass index should be between 19-25. Your Body mass index is 29.77 kg/m. If this is out of the aformentioned range listed, please consider follow up with your Primary Care Provider.   You have been scheduled for a colonoscopy. Please follow written instructions given to you at your visit today.  Please pick up your prep supplies at the pharmacy within the next 1-3 days. If you use inhalers (even only as needed), please bring them with you on the day of your procedure. Your physician has requested that you go to www.startemmi.com and enter the access code given to you at your visit today. This web site gives a general overview about your procedure. However, you should still follow specific instructions given to you by our office regarding your preparation for the procedure.  Thank you for choosing me and [facility].   Tye Savoy, NP

## 2019-05-04 NOTE — Progress Notes (Signed)
Referring Provider: Sherrie Mustache, NP  Reason for Referral : positive Cologuard              ASSESSMENT / PLAN:    # 64 yo male with positive Cologuard.  --positive study not surprising. Patient has occasional rectal bleeding in setting of constipation associated with red meat ( but only if he doesn't take a stool softener).  --Schedule for colonoscopy. The risks and benefits of colonoscopy with possible polypectomy / biopsies were discussed and the patient agrees to proceed.   # Minor painless rectal bleeding -- probably hemorrhoidal. Doubt radiation proctitis. --further evaluation at time of colonoscopy   # prostate cancer s/p radiation / Lupron  HPI:     Chief Complaint:  Positive cologuard   ** History comes from chart and patient  This patient is a 64 year old male who was scheduled for a direct colonoscopy with Dr. Carlean Purl in March 2019.  He canceled the procedure due to some health issues, mainly undergoing treatment for prostate cancer.  He completed radiation, soon due for last Lupron injection. Approximately 3 months ago patient began having small amount of blood with BM.  The bleeding occurs only if he gets constipated after eating red meat and forgets to take a stool softener. He has no abdominal pain or other GI complaints. No [place] of colon cancer.    Data Reviewed:  04/18/18 CMP normal  10/13/18 CBC normal  Past Medical History:  Diagnosis Date  . Arthritis   . Chest pain 08/28/2016   had pleurisy  . Dyslipidemia   . Electrocardiogram suggestive of ST elevation myocardial infarction (STEMI) ([place]) 08/28/2016  . Elevated PSA    03/11/16 PSA of 98.0  . Essential hypertension   . Fatigue   . Hematuria    due to prostate  . Other visual disturbances   . Overactive bladder   . Pericarditis    a. diagnosed in 07/2016 - cath showed no angiographic evidence of CAD.   Marland Kitchen Pneumothorax 1986  . Prostate cancer ([place])   . Tobacco abuse   . Urinary urgency   .  Vitamin D deficiency     Current Outpatient Medications  Medication Sig Dispense Refill  . Ascorbic Acid (VITAMIN C) 1000 MG tablet Take 1,000 mg by mouth daily.     Marland Kitchen ibuprofen (ADVIL) 600 MG tablet Take 1 tablet (600 mg total) by mouth every 6 (six) hours as needed. 30 tablet 0  . Multiple Vitamins-Minerals (CENTRUM PO) Take 1 tablet by mouth daily.     No current facility-administered medications for this visit.   No Known Allergies   Review of Systems:  No chest pain, shortness of breath, urinary symptoms .   Serum creatinine: 0.63 mg/dL (L) 04/19/19 1040 Estimated creatinine clearance: 123.9 mL/min (A)   Physical Exam:    Wt Readings from Last 3 Encounters:  05/04/19 235 lb (106.6 kg)  04/19/19 237 lb (107.5 kg)  10/13/18 232 lb 9.6 oz (105.5 kg)    BP (!) 120/96   Pulse (!) 103   Temp 98.4 F (36.9 C)   Ht 6' 2.5" (1.892 m)   Wt 235 lb (106.6 kg)   BMI 29.77 kg/m  Constitutional:  Pleasant male in no acute distress. Psychiatric: Normal mood and affect. Behavior is normal. Cardiovascular: Normal rate, regular rhythm. No edema Pulmonary/chest: Effort normal and breath sounds normal. No wheezing, rales or rhonchi. Abdominal: Soft, nondistended, nontender. Bowel sounds active throughout. There are no masses palpable. No hepatomegaly. Neurological:  Alert and oriented to person place and time. Skin: Skin is warm and dry. No rashes noted.  Tye Savoy, NP  05/04/2019, 10:04 AM  Cc:  Referring Provider [name], NP

## 2019-05-18 ENCOUNTER — Telehealth: Payer: Self-pay

## 2019-05-18 NOTE — Telephone Encounter (Signed)
That was only during the time we were prescribing the antibiotic and to call if he needed an extended course. This also sounds different, He needs an evaluation (in person) and I would recommend him following up with ENT.

## 2019-05-18 NOTE — Telephone Encounter (Signed)
Spoke with patient, patient was disappointed that Aaron Adkins now states he need to be seen to get another round of antibiotics.    Scheduled appointment for tomorrow with Dr.Reed. Aaron Adkins did not have any openings.

## 2019-05-18 NOTE — Telephone Encounter (Signed)
Incoming call received from patient requesting refill on Ibuprofen (as listed on medication list) and for amoxicillin.   I advised patient that he will need an appointment for a refill on an antibiotic. Patient states Janett Billow told him he would not need an appointment an he could just call for refill.  Patient was taking antibiotic for swollen left gland previously, that has subsided. Patient now with swollen gland on right side of throat. Patient denies any other symptoms.  [place] on file confirmed.   Please advise

## 2019-05-19 ENCOUNTER — Ambulatory Visit (INDEPENDENT_AMBULATORY_CARE_PROVIDER_SITE_OTHER): Payer: Medicare HMO | Admitting: Internal Medicine

## 2019-05-19 ENCOUNTER — Other Ambulatory Visit: Payer: Self-pay

## 2019-05-19 ENCOUNTER — Encounter: Payer: Self-pay | Admitting: Internal Medicine

## 2019-05-19 VITALS — BP 120/82 | HR 72 | Temp 96.9°F | Ht 74.5 in | Wt 231.8 lb

## 2019-05-19 DIAGNOSIS — R69 Illness, unspecified: Secondary | ICD-10-CM | POA: Diagnosis not present

## 2019-05-19 DIAGNOSIS — K115 Sialolithiasis: Secondary | ICD-10-CM | POA: Diagnosis not present

## 2019-05-19 DIAGNOSIS — F17200 Nicotine dependence, unspecified, uncomplicated: Secondary | ICD-10-CM

## 2019-05-19 DIAGNOSIS — Z716 Tobacco abuse counseling: Secondary | ICD-10-CM

## 2019-05-19 DIAGNOSIS — R682 Dry mouth, unspecified: Secondary | ICD-10-CM | POA: Diagnosis not present

## 2019-05-19 DIAGNOSIS — C61 Malignant neoplasm of prostate: Secondary | ICD-10-CM

## 2019-05-19 MED ORDER — AMOXICILLIN-POT CLAVULANATE 875-125 MG PO TABS: 1.0000 | ORAL_TABLET | Freq: Two times a day (BID) | ORAL | 0 refills | Status: DC

## 2019-05-19 NOTE — Progress Notes (Signed)
Location:  [facility] clinic Provider: Trinity Hyland L. Mariea Clonts, D.O., C.M.D.  Goals of Care:  Advanced Directives 10/13/2018  Does Patient Have a Medical Advance Directive? No  Would patient like information on creating a medical advance directive? No - Patient declined     Chief Complaint  Patient presents with  . Acute Visit    Patient with complains of swollen right-sided neck glands    HPI: Patient is a 64 y.o. male seen today for an acute visit for Swollen glands on his left side of his face.  He has no fever, chills and feelings like he's coming down with a cold. Gets lupron thru [facility].    No remaining problem on the left side.  Right side just began a couple of days ago.  Can swallow.  When he chews it swells more.  Not eating much due to this.    He's been doing the warm compresses, taking ibuprofen, and massaging it.  He's not done lemon drops yet.    He just had his first covid shot.    He had his flu shot.    Lupron causing swelling of ankles.  Says he has many of the potential side effects.  He does have 2-3/7 days dry mouth.  He did have a bad bout the other night where he could barely swallow his mouth was so dry.  Doesn't sleep well.  Will nap to make up for it.  F/u with urology is next month.  PSA has been coming down.  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  He liked working a little bit.    Is down to 3 cigarettes per day.  He knows he really should quit.  Reinforced the importance of this with his prostate cancer being treated and now these recurrent salivary stone infections.  Discussed potential medications to help with cessation including chantix and wellbutrin, patches, etc.  Past Medical History:  Diagnosis Date  . Arthritis   . Chest pain 08/28/2016   had pleurisy  . Dyslipidemia   . Electrocardiogram suggestive of ST elevation myocardial infarction (STEMI) ([place]) 08/28/2016  . Elevated PSA    03/11/16 PSA of 98.0  .  Essential hypertension   . Fatigue   . Hematuria    due to prostate  . Other visual disturbances   . Overactive bladder   . Pericarditis    a. diagnosed in 07/2016 - cath showed no angiographic evidence of CAD.   Marland Kitchen Pneumothorax 1986  . Prostate cancer ([place])   . Tobacco abuse   . Urinary urgency   . Vitamin D deficiency     Past Surgical History:  Procedure Laterality Date  . DENTAL SURGERY     all teeth removed/ has complete denture  . LEFT HEART CATH AND CORONARY ANGIOGRAPHY N/A 08/28/2016   Procedure: Left Heart Cath and Coronary Angiography;  Surgeon: Burnell Blanks, MD;  Location: [place] [facility];  Service: Cardiovascular;  Laterality: N/A;  . PROSTATE BIOPSY    . WISDOM TOOTH EXTRACTION      No Known Allergies  Outpatient Encounter Medications as of 05/19/2019  Medication Sig  . Ascorbic Acid (VITAMIN C) 1000 MG tablet Take 1,000 mg by mouth daily.   Marland Kitchen ibuprofen (ADVIL) 600 MG tablet Take 1 tablet (600 mg total) by mouth every 6 (six) hours as needed.  . Multiple Vitamins-Minerals (CENTRUM PO) Take 1 tablet by mouth daily.   No facility-administered encounter medications on  file as of 05/19/2019.    Review of Systems:  Review of Systems  Constitutional: Negative for chills and fever.  HENT: Negative for congestion, ear pain, sinus pain and sore throat.        Pain when chewing and swelling in front of ear and beneath ear  Respiratory: Negative for cough and shortness of breath.   Cardiovascular: Positive for leg swelling.  Musculoskeletal: Negative for falls.  Psychiatric/Behavioral:       Tobacco abuse    Health Maintenance  Topic Date Due  . TETANUS/TDAP  Never done  . Fecal DNA (Cologuard)  04/18/2020 (Originally 10/05/2005)  . INFLUENZA VACCINE  Completed  . Hepatitis C Screening  Completed  . HIV Screening  Completed    Physical Exam: Vitals:   05/19/19 0929  BP: 120/82  Pulse: 72  Temp: (!) 96.9 F (36.1 C)  TempSrc: Temporal  SpO2:  97%  Weight: 231 lb 12.8 oz (105.1 kg)  Height: 6' 2.5" (1.892 m)   Body mass index is 29.36 kg/m. Physical Exam Vitals reviewed.  Constitutional:      General: He is not in acute distress.    Appearance: He is normal weight. He is not toxic-appearing.  HENT:     Head: Normocephalic and atraumatic.     Comments: Swelling of right face anterior to ear and neck inferior to ear; slightly tender over the area Pulmonary:     Effort: Pulmonary effort is normal.  Abdominal:     General: Bowel sounds are normal.  Musculoskeletal:        General: Normal range of motion.  Lymphadenopathy:     Cervical: Cervical adenopathy present.  Skin:    General: Skin is warm.  Neurological:     General: No focal deficit present.     Mental Status: He is alert and oriented to person, place, and time.  Psychiatric:        Mood and Affect: Mood normal.     Labs reviewed: Basic Metabolic Panel: Recent Labs    10/13/18 0955 04/19/19 1040  NA 138 140  K 4.3 4.1  CL 107 109  CO2 23 23  GLUCOSE 82 85  BUN 11 7  CREATININE 0.57* 0.63*  CALCIUM 9.9 9.3   Liver Function Tests: Recent Labs    10/13/18 0955 04/19/19 1040  AST 26 17  ALT 17 10  BILITOT 0.8 0.5  PROT 7.7 7.2   No results for input(s): LIPASE, AMYLASE in the last 8760 hours. No results for input(s): AMMONIA in the last 8760 hours. CBC: Recent Labs    10/13/18 0955  WBC 5.6  NEUTROABS 3,349  HGB 13.6  HCT 39.5  MCV 91.9  PLT 301   Lipid Panel: Recent Labs    10/13/18 0955 04/19/19 1040  CHOL 211* 161  HDL 48 48  LDLCALC 142* 97  TRIG 97 71  CHOLHDL 4.4 3.4   Lab Results  Component Value Date   HGBA1C 5.5 03/11/2016   Assessment/Plan 1. Stone of salivary gland or duct - with infection -is tender and just like he had on left and was proceeded by an episode of very dry mouth - amoxicillin-clavulanate (AUGMENTIN) 875-125 MG tablet; Take 1 tablet by mouth 2 (two) times daily.  Dispense: 20 tablet; Refill:  0 -warm compresses, massage, lemon lozenges, abx above -notify us if not resolving  2. Dry mouth -recommended at least 8 8oz glasses of water per day plus biotene when he notes dry mouth, smoking cessation  3. Malignant neoplasm of prostate ([place]) -had radiation and receiving lupron -seems that the lupron has led to his dry mouth and now salivary stones  4. Smoking addiction -counseled on risks of ongoing smoking especially in view of his prostate cancer and dry mouth issues -he understands he should quit, but has smoked some with stress of covid and prostate ca -he is going to work to get off his last 3 cigarettes--reports he will call us if he wants to try chantix, wellbutrin, etc.   5. Encounter for smoking cessation counseling -more than 3 mins spent on smoking cessation counseling today  Labs/tests ordered:   Lab Orders  No laboratory test(s) ordered today    Next appt:  10/16/2019  Candelario Steppe L. Akeelah Seppala, D.O. [facility] [address], [place] [address] Cell Phone (Mon-Fri 8am-5pm):  [phone] On Call:  [phone] & follow prompts after 5pm & weekends Office Phone:  [phone] Office Fax:  [phone]

## 2019-05-19 NOTE — Patient Instructions (Addendum)
Warm compresses Push fluids--8 8oz glasses water per day. Massage gland area Complete antibiotic course Use lemon drops You may try biotene spray/mouthwash and similar products to keep your mouth moist.   Call us back if this does not resolve.    Salivary Gland Infection  A salivary gland infection is an infection in one or more of the glands that produce saliva. You have six major salivary glands. Each gland has a duct that carries saliva into your mouth. Saliva keeps your mouth moist and breaks down the food that you eat. It also helps prevent tooth decay. Two salivary glands are located just in front of your ears (parotid). The ducts for these glands open up inside your cheeks, near your back teeth. You also have two glands under your tongue (sublingual) and two glands under your jaw (submandibular). The ducts for these glands open under your tongue. Any salivary gland can become infected. Most infections occur in the parotid glands or submandibular glands. What are the causes? This condition may be caused by bacteria or viruses.  The bacteria that cause salivary gland infections are usually the same bacteria that normally live in your mouth. ? A stone can form in a salivary gland and block the flow of saliva. As a result, saliva backs up into the salivary gland. Bacteria may then start to grow behind the blockage and cause infection. ? Bacterial infections usually cause pain and swelling on one side of the face. Submandibular gland swelling occurs under the jaw. Parotid swelling occurs in front of the ear. ? Bacterial infections are more common in adults.  The mumps virus is the most common cause of viral salivary gland infections. However, mumps is now rare because of vaccination. ? This infection causes swelling in both parotid glands. ? Viral infections are more common in children. What increases the risk? The following factors may make you more likely to develop a bacterial  infection:  Not taking good care of your mouth and teeth (poor oral hygiene).  Smoking.  Not drinking enough water.  Having a disease that causes dry mouth and dry eyes (Mikulicz syndrome or Sjogren syndrome). A viral infection is more likely to occur in children who do not get the MMR (measles, mumps, rubella) vaccine. What are the signs or symptoms? The main sign of a salivary gland infection is a swollen salivary gland. This type of inflammation is often called sialadenitis. You may have swelling in front of your ear, under your jaw, or under your tongue. Swelling may get worse when you eat and decrease after you eat. Other signs and symptoms include:  Pain.  Tenderness.  Redness.  Dry mouth.  Bad taste in your mouth.  Difficulty chewing and swallowing.  Fever. How is this diagnosed? This condition may be diagnosed based on:  Your signs and symptoms.  A physical exam. During the exam, your health care provider will look and feel inside your mouth to see whether a stone is blocking a salivary gland duct.  Tests, such as: ? An X-ray to check for a stone. ? An ultrasound, CT scan, or MRI to look for an abscess and to rule out other causes of swelling. ? A culture and sensitivity test. In this test, a sample of pus is taken from the salivary gland with a swab or by using a needle (aspiration). The sample is tested in a lab to determine the type of bacteria that is growing and which antibiotic medicines will work against it. You may  need to see an ear, nose, and throat specialist (ENT or otolaryngologist) for diagnosis and treatment. How is this treated? Viral salivary gland infections usually clear up without treatment. Bacterial infections are usually treated with antibiotic medicine. Severe infections that cause difficulty with swallowing may be treated with an IV antibiotic in the hospital. Other treatments may include:  Probing and widening the salivary duct to allow a  stone to pass. In some cases, a thin, flexible scope (endoscope) may be inserted into the duct to find a stone and remove it.  Breaking up a stone using sound waves.  Draining an infected gland (abscess) with a needle.  Surgery to: ? Remove a stone. ? Drain pus from an abscess. ? Remove a badly infected gland. Follow these instructions at home:  Medicines  Take over-the-counter and prescription medicines only as told by your health care provider.  If you were prescribed an antibiotic medicine, take it as told by your health care provider. Do not stop taking the antibiotic even if you start to feel better. To relieve discomfort  Follow these instructions every few hours: ? Suck on a lemon candy to stimulate the flow of saliva. ? Put a warm compress over the gland. ? Gently massage the gland.  Rinse your mouth with a salt-water mixture 3-4 times a day or as needed. To make a salt-water mixture, completely dissolve -1 tsp of salt in 1 cup of warm water. General instructions  Practice good oral hygiene by brushing and flossing your teeth after meals and before you go to bed.  Drink enough fluid to keep your urine pale yellow.  Do not use any products that contain nicotine or tobacco, such as cigarettes and e-cigarettes. If you need help quitting, ask your health care provider.  Keep all follow-up visits as told by your health care provider. This is important. Contact a health care provider if:  You have pain and swelling in your face, jaw, or mouth after eating.  You have persistent swelling in any of these places: ? In front of your ear. ? Under your jaw. ? Inside your mouth. Get help right away if:  You have pain and swelling in your face, jaw, or mouth, and this is getting worse.  Your pain and swelling make it hard to swallow or breathe. Summary  A salivary gland infection is an infection in one or more of the glands that produce saliva. You have six major salivary  glands. Each gland has a duct that carries saliva into your mouth.  Any salivary gland can become infected. Most infections occur in the glands just in front of your ears (parotid glands) or the glands under your jaw (submandibular glands).  This condition may be caused by bacteria or viruses.  Salivary gland infections caused by a virus usually clear up without treatment. Bacterial infections are usually treated with antibiotic medicine. This information is not intended to replace advice given to you by your health care provider. Make sure you discuss any questions you have with your health care provider. Document Revised: 03/17/2017 Document Reviewed: 03/17/2017 Elsevier Patient Education  2020 [employer].  Salivary Stone  A salivary stone is a mineral deposit that builds up in the ducts that drain your salivary glands. Most salivary stones are made of calcium. When a stone forms, saliva can back up into the gland and cause painful swelling. Your salivary glands are the glands that produce saliva. You have six major salivary glands. Each gland has a duct  that carries saliva into your mouth. Saliva keeps your mouth moist and breaks down the food that you eat. It also helps prevent tooth decay. Two salivary glands are located just in front of your ears (parotid). The ducts for these glands open up inside your cheeks, near your back teeth. You also have two glands under your tongue (sublingual) and two glands under your jaw (submandibular). The ducts for these glands open under your tongue. A stone can form in any salivary gland. The most common place for a salivary stone to develop is in a submandibular salivary gland. What are the causes? Salivary stones may be caused by any condition that reduces the flow of saliva. It is not known why some people form stones. What increases the risk? You are more likely to develop this condition if:  You are male.  You do not drink enough water.  You  smoke.  You have any of the following: ? High blood pressure. ? Gout. ? Diabetes. What are the signs or symptoms? The main sign of a salivary gland stone is sudden swelling of a salivary gland when eating. This usually happens under the jaw on one side. Other signs and symptoms may include:  Swelling of the cheek or under the tongue when eating.  Pain in the swollen area.  Trouble chewing or swallowing.  Swelling that goes down after eating. How is this diagnosed? This condition may be diagnosed based on:  Your signs and symptoms.  A physical exam. In many cases, your health care provider will be able to feel the stone in a duct inside your mouth.  Imaging studies, such as: ? X-rays. ? Ultrasound. ? CT scan. ? MRI. You may need to see an ear, nose, and throat specialist (ENT or otolaryngologist) for diagnosis and treatment. How is this treated? Treatment for this condition depends on the size of the stone.  A small stone that is not causing symptoms may be treated with home care.  For a stone that is large enough to cause symptoms, the treatment options may include: ? Probing and widening of the duct to allow the stone to pass. ? Inserting a thin, flexible scope (endoscope) into the duct to locate and remove the stone. ? Breaking up the stone with sound waves. ? Removing the entire salivary gland. Follow these instructions at home:  To relieve discomfort  Follow these instructions every few hours: ? Suck on a lemon candy to stimulate the flow of saliva. ? Put a warm compress over the gland. ? Gently massage the gland. General instructions  Drink enough fluid to keep your urine pale yellow.  Do not use any products that contain nicotine or tobacco, such as cigarettes and e-cigarettes. If you need help quitting, ask your health care provider.  Keep all follow-up visits as told by your health care provider. This is important. Contact a health care provider  if:  You have pain and swelling in your face, jaw, or mouth after eating.  You have persistent swelling in any of these places: ? In front of your ear. ? Under your jaw. ? Inside your mouth. Get help right away if:  You have pain and swelling in your face, jaw, or mouth, and this is getting worse.  Your pain and swelling make it hard to swallow or breathe. Summary  A salivary stone is a mineral deposit that builds up in the ducts that drain your salivary glands.  When a stone forms, saliva can back up  into the gland and cause painful swelling.  Salivary stones may be caused by any condition that reduces the flow of saliva.  Treatment for this condition depends on the size of the stone. This information is not intended to replace advice given to you by your health care provider. Make sure you discuss any questions you have with your health care provider. Document Revised: 03/29/2017 Document Reviewed: 03/29/2017 Elsevier Patient Education  2020 [employer].

## 2019-06-08 ENCOUNTER — Other Ambulatory Visit: Payer: Self-pay

## 2019-06-08 ENCOUNTER — Encounter: Payer: Self-pay | Admitting: Internal Medicine

## 2019-06-08 ENCOUNTER — Other Ambulatory Visit: Payer: Self-pay | Admitting: Internal Medicine

## 2019-06-08 ENCOUNTER — Ambulatory Visit (INDEPENDENT_AMBULATORY_CARE_PROVIDER_SITE_OTHER): Payer: Medicare HMO

## 2019-06-08 DIAGNOSIS — Z1159 Encounter for screening for other viral diseases: Secondary | ICD-10-CM | POA: Diagnosis not present

## 2019-06-08 LAB — SARS CORONAVIRUS 2 (TAT 6-24 HRS): SARS Coronavirus 2: NEGATIVE

## 2019-06-12 ENCOUNTER — Ambulatory Visit (AMBULATORY_SURGERY_CENTER): Payer: Medicare HMO | Admitting: Internal Medicine

## 2019-06-12 ENCOUNTER — Other Ambulatory Visit: Payer: Self-pay

## 2019-06-12 ENCOUNTER — Encounter: Payer: Self-pay | Admitting: Internal Medicine

## 2019-06-12 VITALS — BP 124/83 | HR 60 | Temp 96.6°F | Resp 14 | Ht 74.0 in | Wt 235.0 lb

## 2019-06-12 DIAGNOSIS — D125 Benign neoplasm of sigmoid colon: Secondary | ICD-10-CM | POA: Diagnosis not present

## 2019-06-12 DIAGNOSIS — R195 Other fecal abnormalities: Secondary | ICD-10-CM | POA: Diagnosis not present

## 2019-06-12 DIAGNOSIS — K5730 Diverticulosis of large intestine without perforation or abscess without bleeding: Secondary | ICD-10-CM

## 2019-06-12 DIAGNOSIS — D124 Benign neoplasm of descending colon: Secondary | ICD-10-CM

## 2019-06-12 DIAGNOSIS — K625 Hemorrhage of anus and rectum: Secondary | ICD-10-CM

## 2019-06-12 DIAGNOSIS — I10 Essential (primary) hypertension: Secondary | ICD-10-CM | POA: Diagnosis not present

## 2019-06-12 DIAGNOSIS — D120 Benign neoplasm of cecum: Secondary | ICD-10-CM | POA: Diagnosis not present

## 2019-06-12 DIAGNOSIS — K627 Radiation proctitis: Secondary | ICD-10-CM | POA: Diagnosis not present

## 2019-06-12 HISTORY — DX: Radiation proctitis: K62.7

## 2019-06-12 MED ORDER — SODIUM CHLORIDE 0.9 % IV SOLN: 500.0000 mL | Freq: Once | INTRAVENOUS | Status: DC

## 2019-06-12 NOTE — Progress Notes (Signed)
Called to room to assist during endoscopic procedure.  Patient ID and intended procedure confirmed with present staff. Received instructions for my participation in the procedure from the performing physician.  

## 2019-06-12 NOTE — Op Note (Signed)
[facility] Patient Name: Aaron Adkins Procedure Date: 06/12/2019 11:10 AM MRN: [id number] Endoscopist: Gatha Mayer , MD Age: 64 Referring MD:  Date of Birth: [birth year] Gender: Male Account #: [id number] Procedure:                Colonoscopy Indications:              Rectal bleeding Medicines:                Propofol per Anesthesia, Monitored Anesthesia Care Procedure:                Pre-Anesthesia Assessment:                           - Prior to the procedure, a History and Physical                            was performed, and patient medications and                            allergies were reviewed. The patient's tolerance of                            previous anesthesia was also reviewed. The risks                            and benefits of the procedure and the sedation                            options and risks were discussed with the patient.                            All questions were answered, and informed consent                            was obtained. Prior Anticoagulants: The patient has                            taken no previous anticoagulant or antiplatelet                            agents. ASA Grade Assessment: II - A patient with                            mild systemic disease. After reviewing the risks                            and benefits, the patient was deemed in                            satisfactory condition to undergo the procedure.                           After obtaining informed consent, the colonoscope  was passed under direct vision. Throughout the                            procedure, the patient's blood pressure, pulse, and                            oxygen saturations were monitored continuously. The                            Colonoscope was introduced through the anus and                            advanced to the the cecum, identified by                            appendiceal orifice and  ileocecal valve. The                            colonoscopy was performed without difficulty. The                            patient tolerated the procedure well. The quality                            of the bowel preparation was good. The bowel                            preparation used was Miralax via split dose                            instruction. The ileocecal valve, appendiceal                            orifice, and rectum were photographed. Scope In: 11:17:22 AM Scope Out: 11:32:49 AM Scope Withdrawal Time: 0 hours 11 minutes 33 seconds  Total Procedure Duration: 0 hours 15 minutes 27 seconds  Findings:                 The perianal and digital rectal examinations were                            normal.                           Three sessile polyps were found in the sigmoid                            colon, descending colon and cecum. The polyps were                            diminutive in size. These polyps were removed with                            a cold snare. Resection and retrieval were  complete. Verification of patient identification                            for the specimen was done. Estimated blood loss was                            minimal.                           The mucosa vascular pattern in the distal rectum                            was increased.                           Multiple diverticula were found in the sigmoid                            colon and descending colon.                           The exam was otherwise without abnormality on                            direct and retroflexion views. Complications:            No immediate complications. Estimated Blood Loss:     Estimated blood loss was minimal. Impression:               - Three diminutive polyps in the sigmoid colon, in                            the descending colon and in the cecum, removed with                            a cold snare. Resected and  retrieved.                           - Increased mucosa vascular pattern in the distal                            rectum, oozing blood = Radiation-asociated vascular                            ectasia (RAVE) - from prostate radiation - cause of                            rectal bleeding                           - Diverticulosis in the sigmoid colon and in the                            descending colon.                           -  The examination was otherwise normal on direct                            and retroflexion views. Recommendation:           - Patient has a contact number available for                            emergencies. The signs and symptoms of potential                            delayed complications were discussed with the                            patient. Return to normal activities tomorrow.                            Written discharge instructions were provided to the                            patient.                           - Resume previous diet.                           - Continue present medications.                           - Repeat colonoscopy is recommended. The                            colonoscopy date will be determined after pathology                            results from today's exam become available for                            review.                           - observe - if rectal bleeding causes anemia or                            becaomes a nuisance consider RFA ablation of RAVE Gatha Mayer, MD 06/12/2019 11:48:14 AM This report has been signed electronically.

## 2019-06-12 NOTE — Progress Notes (Signed)
Report given to PACU, vss 

## 2019-06-12 NOTE — Patient Instructions (Addendum)
I found and removed 3 tiny polyps that look benign.  There are changes in the rectum called vascular ectasia or radiation proctitis - an overgrowth of tiny blood vessels on the surface due to the radiation treatment you had. This may bleed some - it can be cauterized with a special procedure at the hospital but I suggest you see how it goes. If you lose a lot of blood (unlikely) or it gets very frequent we can do that treatment.  I will let you know pathology results and when to have another routine colonoscopy by mail and/or My Chart.  I appreciate the opportunity to care for you. Gatha Mayer, MD, [facility]  HANDOUTS GIVEN:  POLYPS AND DIVERTICULOSIS RESUME PREVIOUS DIET CONTINUE PRESENT MEDICATIONS REPEAT COLONOSCOPY IS RECOMMENDED  AND DATE OF PROCEEDURE  WILL BE DETERMINED ONCE PATHOLOGY RESULTS ARE RECEIVED.  YOU HAD AN ENDOSCOPIC PROCEDURE TODAY AT [facility]:   Refer to the procedure report that was given to you for any specific questions about what was found during the examination.  If the procedure report does not answer your questions, please call your gastroenterologist to clarify.  If you requested that your care partner not be given the details of your procedure findings, then the procedure report has been included in a sealed envelope for you to review at your convenience later.  YOU SHOULD EXPECT: Some feelings of bloating in the abdomen. Passage of more gas than usual.  Walking can help get rid of the air that was put into your GI tract during the procedure and reduce the bloating. If you had a lower endoscopy (such as a colonoscopy or flexible sigmoidoscopy) you may notice spotting of blood in your stool or on the toilet paper. If you underwent a bowel prep for your procedure, you may not have a normal bowel movement for a few days.  Please Note:  You might notice some irritation and congestion in your nose or some drainage.  This is from the oxygen used during  your procedure.  There is no need for concern and it should clear up in a day or so.  SYMPTOMS TO REPORT IMMEDIATELY:   Following lower endoscopy (colonoscopy or flexible sigmoidoscopy):  Excessive amounts of blood in the stool  Significant tenderness or worsening of abdominal pains  Swelling of the abdomen that is new, acute  Fever of 100F or higher  For urgent or emergent issues, a gastroenterologist can be reached at any hour by calling [phone]. Do not use MyChart messaging for urgent concerns.    DIET:  We do recommend a small meal at first, but then you may proceed to your regular diet.  Drink plenty of fluids but you should avoid alcoholic beverages for 24 hours.  ACTIVITY:  You should plan to take it easy for the rest of today and you should NOT DRIVE or use heavy machinery until tomorrow (because of the sedation medicines used during the test).    FOLLOW UP: Our staff will call the number listed on your records 48-72 hours following your procedure to check on you and address any questions or concerns that you may have regarding the information given to you following your procedure. If we do not reach you, we will leave a message.  We will attempt to reach you two times.  During this call, we will ask if you have developed any symptoms of COVID 19. If you develop any symptoms (ie: fever, flu-like symptoms, shortness of breath,  cough etc.) before then, please call [phone].  If you test positive for Covid 19 in the 2 weeks post procedure, please call and report this information to [place].    If any biopsies were taken you will be contacted by phone or by letter within the next 1-3 weeks.  Please call us at [phone] if you have not heard about the biopsies in 3 weeks.    SIGNATURES/CONFIDENTIALITY: You and/or your care partner have signed paperwork which will be entered into your electronic medical record.  These signatures attest to the fact that that the information  above on your After Visit Summary has been reviewed and is understood.  Full responsibility of the confidentiality of this discharge information lies with you and/or your care-partner.

## 2019-06-12 NOTE — Progress Notes (Signed)
Pt's states no medical or surgical changes since previsit or office visit.  LC - temp KA - vitals 

## 2019-06-13 DIAGNOSIS — C61 Malignant neoplasm of prostate: Secondary | ICD-10-CM | POA: Diagnosis not present

## 2019-06-14 ENCOUNTER — Telehealth: Payer: Self-pay | Admitting: *Deleted

## 2019-06-14 ENCOUNTER — Telehealth: Payer: Self-pay

## 2019-06-14 NOTE — Telephone Encounter (Signed)
1. Have you developed a fever since your procedure? no  2.   Have you had an respiratory symptoms (SOB or cough) since your procedure? no  3.   Have you tested positive for COVID 19 since your procedure no  4.   Have you had any family members/close contacts diagnosed with the COVID 19 since your procedure?  no   If yes to any of these questions please route to Joylene John, RN and Erenest Rasher, RN   Follow up Call-  Call back number 06/12/2019  Post procedure Call Back phone  # [phone]  Permission to leave phone message Yes  Some recent data might be hidden     Patient questions:  Do you have a fever, pain , or abdominal swelling? No. Pain Score  0 *  Have you tolerated food without any problems? Yes.    Have you been able to return to your normal activities? Yes.    Do you have any questions about your discharge instructions: Diet   No. Medications  No. Follow up visit  No.  Do you have questions or concerns about your Care? No.  Actions: * If pain score is 4 or above: No action needed, pain <4.

## 2019-06-14 NOTE — Telephone Encounter (Signed)
No answer, left message to call back later today, B.Clemente Dewey RN. 

## 2019-06-19 ENCOUNTER — Encounter: Payer: Self-pay | Admitting: Internal Medicine

## 2019-06-19 DIAGNOSIS — Z860101 Personal history of adenomatous and serrated colon polyps: Secondary | ICD-10-CM

## 2019-06-19 DIAGNOSIS — Z86010 Personal history of colonic polyps: Secondary | ICD-10-CM

## 2019-06-19 HISTORY — DX: Personal history of colonic polyps: Z86.010

## 2019-06-19 HISTORY — DX: Personal history of adenomatous and serrated colon polyps: Z86.0101

## 2019-06-21 DIAGNOSIS — N522 Drug-induced erectile dysfunction: Secondary | ICD-10-CM | POA: Diagnosis not present

## 2019-06-21 DIAGNOSIS — C61 Malignant neoplasm of prostate: Secondary | ICD-10-CM | POA: Diagnosis not present

## 2019-06-21 DIAGNOSIS — N401 Enlarged prostate with lower urinary tract symptoms: Secondary | ICD-10-CM | POA: Diagnosis not present

## 2019-06-21 DIAGNOSIS — C775 Secondary and unspecified malignant neoplasm of intrapelvic lymph nodes: Secondary | ICD-10-CM | POA: Diagnosis not present

## 2019-06-21 DIAGNOSIS — R351 Nocturia: Secondary | ICD-10-CM | POA: Diagnosis not present

## 2019-06-27 IMAGING — NM NM BONE WHOLE BODY
2 series · 2 of 2 positions shown · non-contrast
Comparison: CT 05/10/2017.

CLINICAL DATA: Prostate cancer.

EXAM:
NUCLEAR MEDICINE WHOLE BODY BONE SCAN
TECHNIQUE: Whole body anterior and posterior images were obtained approximately
3 hours after intravenous injection of radiopharmaceutical.
RADIOPHARMACEUTICALS:  21.8 mCi 9echnetium-11m MDP IV

[Series 1: whole body · 2.66mm/px · 1 of 1 slices shown (1 of 2)]
[im 1/1]
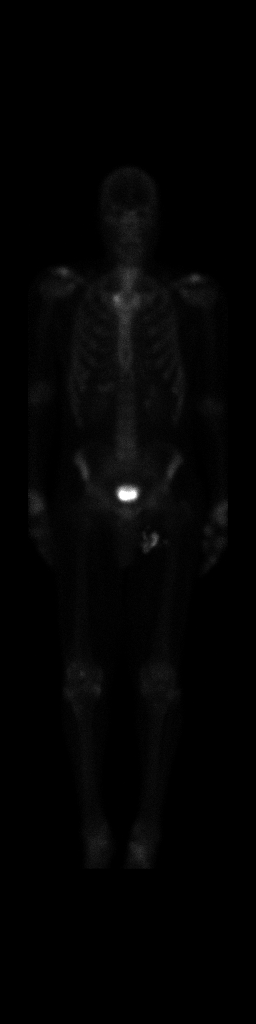

[Series 1: whole body · 2.66mm/px · 1 of 1 slices shown (2 of 2)]
[im 1/1]
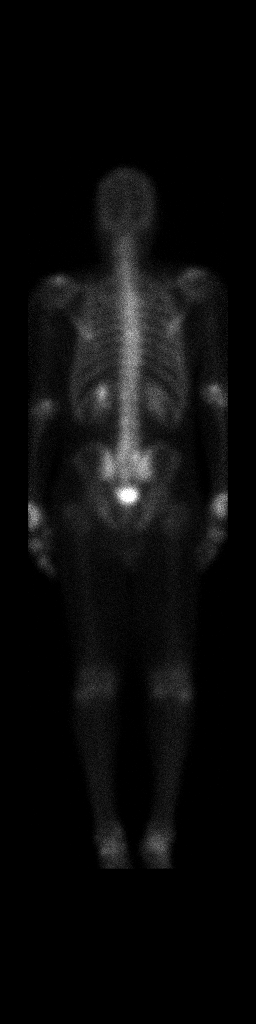

[2 of 2 positions shown; findings below may reference images not displayed]

FINDINGS: Bilateral renal function excretion. Mild increased activity noted
over the kidneys bilaterally most likely in the renal collecting
system. No hydronephrosis noted on today's CT. Mild increased
activity noted about the right knee, most likely degenerative. No
focal bony abnormalities noted to suggest metastatic disease.
IMPRESSION: No evidence of bony metastatic disease.

## 2019-06-29 DIAGNOSIS — E663 Overweight: Secondary | ICD-10-CM | POA: Diagnosis not present

## 2019-06-29 DIAGNOSIS — Z01118 Encounter for examination of ears and hearing with other abnormal findings: Secondary | ICD-10-CM | POA: Diagnosis not present

## 2019-06-29 DIAGNOSIS — Z0001 Encounter for general adult medical examination with abnormal findings: Secondary | ICD-10-CM | POA: Diagnosis not present

## 2019-06-29 DIAGNOSIS — E559 Vitamin D deficiency, unspecified: Secondary | ICD-10-CM | POA: Diagnosis not present

## 2019-06-29 DIAGNOSIS — Z136 Encounter for screening for cardiovascular disorders: Secondary | ICD-10-CM | POA: Diagnosis not present

## 2019-06-29 DIAGNOSIS — I10 Essential (primary) hypertension: Secondary | ICD-10-CM | POA: Diagnosis not present

## 2019-06-29 DIAGNOSIS — Z01021 Encounter for examination of eyes and vision following failed vision screening with abnormal findings: Secondary | ICD-10-CM | POA: Diagnosis not present

## 2019-06-29 DIAGNOSIS — Z87891 Personal history of nicotine dependence: Secondary | ICD-10-CM | POA: Diagnosis not present

## 2019-06-29 DIAGNOSIS — Z1329 Encounter for screening for other suspected endocrine disorder: Secondary | ICD-10-CM | POA: Diagnosis not present

## 2019-06-29 DIAGNOSIS — Z131 Encounter for screening for diabetes mellitus: Secondary | ICD-10-CM | POA: Diagnosis not present

## 2019-07-13 DIAGNOSIS — I10 Essential (primary) hypertension: Secondary | ICD-10-CM | POA: Diagnosis not present

## 2019-07-13 DIAGNOSIS — R7303 Prediabetes: Secondary | ICD-10-CM | POA: Diagnosis not present

## 2019-07-13 DIAGNOSIS — E559 Vitamin D deficiency, unspecified: Secondary | ICD-10-CM | POA: Diagnosis not present

## 2019-07-13 DIAGNOSIS — E663 Overweight: Secondary | ICD-10-CM | POA: Diagnosis not present

## 2019-07-13 DIAGNOSIS — Z87891 Personal history of nicotine dependence: Secondary | ICD-10-CM | POA: Diagnosis not present

## 2019-07-13 DIAGNOSIS — Z0001 Encounter for general adult medical examination with abnormal findings: Secondary | ICD-10-CM | POA: Diagnosis not present

## 2019-07-24 ENCOUNTER — Encounter (HOSPITAL_COMMUNITY): Payer: Self-pay

## 2019-07-24 ENCOUNTER — Emergency Department (HOSPITAL_COMMUNITY)
Admission: EM | Admit: 2019-07-24 | Discharge: 2019-07-24 | Disposition: A | Discharge status: Home / Self Care | Payer: Medicare HMO | Attending: Emergency Medicine | Admitting: Emergency Medicine

## 2019-07-24 ENCOUNTER — Emergency Department (HOSPITAL_COMMUNITY): Payer: Medicare HMO

## 2019-07-24 ENCOUNTER — Other Ambulatory Visit: Payer: Self-pay

## 2019-07-24 DIAGNOSIS — S20419A Abrasion of unspecified back wall of thorax, initial encounter: Secondary | ICD-10-CM | POA: Insufficient documentation

## 2019-07-24 DIAGNOSIS — I10 Essential (primary) hypertension: Secondary | ICD-10-CM | POA: Insufficient documentation

## 2019-07-24 DIAGNOSIS — W19XXXA Unspecified fall, initial encounter: Secondary | ICD-10-CM

## 2019-07-24 DIAGNOSIS — Z79899 Other long term (current) drug therapy: Secondary | ICD-10-CM | POA: Insufficient documentation

## 2019-07-24 DIAGNOSIS — Z8546 Personal history of malignant neoplasm of prostate: Secondary | ICD-10-CM | POA: Insufficient documentation

## 2019-07-24 DIAGNOSIS — S30810A Abrasion of lower back and pelvis, initial encounter: Secondary | ICD-10-CM | POA: Diagnosis not present

## 2019-07-24 DIAGNOSIS — Y92512 Supermarket, store or market as the place of occurrence of the external cause: Secondary | ICD-10-CM | POA: Insufficient documentation

## 2019-07-24 DIAGNOSIS — S299XXA Unspecified injury of thorax, initial encounter: Secondary | ICD-10-CM | POA: Diagnosis not present

## 2019-07-24 DIAGNOSIS — F17210 Nicotine dependence, cigarettes, uncomplicated: Secondary | ICD-10-CM | POA: Insufficient documentation

## 2019-07-24 DIAGNOSIS — T07XXXA Unspecified multiple injuries, initial encounter: Secondary | ICD-10-CM

## 2019-07-24 DIAGNOSIS — S20311A Abrasion of right front wall of thorax, initial encounter: Secondary | ICD-10-CM | POA: Diagnosis not present

## 2019-07-24 DIAGNOSIS — R69 Illness, unspecified: Secondary | ICD-10-CM | POA: Diagnosis not present

## 2019-07-24 DIAGNOSIS — W01118A Fall on same level from slipping, tripping and stumbling with subsequent striking against other sharp object, initial encounter: Secondary | ICD-10-CM | POA: Diagnosis not present

## 2019-07-24 DIAGNOSIS — M545 Low back pain: Secondary | ICD-10-CM | POA: Diagnosis not present

## 2019-07-24 DIAGNOSIS — I252 Old myocardial infarction: Secondary | ICD-10-CM | POA: Insufficient documentation

## 2019-07-24 DIAGNOSIS — Y9301 Activity, walking, marching and hiking: Secondary | ICD-10-CM | POA: Diagnosis not present

## 2019-07-24 DIAGNOSIS — Y998 Other external cause status: Secondary | ICD-10-CM | POA: Diagnosis not present

## 2019-07-24 DIAGNOSIS — F1210 Cannabis abuse, uncomplicated: Secondary | ICD-10-CM | POA: Insufficient documentation

## 2019-07-24 DIAGNOSIS — M546 Pain in thoracic spine: Secondary | ICD-10-CM | POA: Diagnosis not present

## 2019-07-24 MED ORDER — CYCLOBENZAPRINE HCL 10 MG PO TABS: 10.0000 mg | ORAL_TABLET | Freq: Every day | ORAL | 0 refills | Status: AC

## 2019-07-24 NOTE — ED Provider Notes (Signed)
[facility] DEPT Provider Note   CSN: [id number] Arrival date & time: 07/24/19  1234     History Chief Complaint  Patient presents with  . Fall  . Abrasion    Aaron Adkins. is a 64 y.o. male.  HPI HPI Comments: Aaron Adkins. is a 64 y.o. male who presents to the Emergency Department complaining of a fall. Pt states that he slipped on a "wet spot" on a tile surface at a store. He fell backwards into some metal shelving. He has large abrasions with no bleeding to right thoracic and lumbar region. Pain diffusely in the prior mentioned regions. Pain worsens with ambulation. He has not taken anything for his pain. He denies numbness, tingling, bowel or bladder incontinence, head trauma, syncope.    Past Medical History:  Diagnosis Date  . Arthritis   . Chest pain 08/28/2016   had pleurisy  . Dyslipidemia   . Electrocardiogram suggestive of ST elevation myocardial infarction (STEMI) ([place]) 08/28/2016  . Elevated PSA    03/11/16 PSA of 98.0  . Essential hypertension   . Fatigue   . Hematuria    due to prostate  . Hx of adenomatous colonic polyps 06/19/2019  . Other visual disturbances   . Overactive bladder   . Pericarditis    a. diagnosed in 07/2016 - cath showed no angiographic evidence of CAD.   Marland Kitchen Pneumothorax 1986  . Proctitis, radiation - vascular ectasia 06/12/2019  . Prostate cancer ([place])   . Tobacco abuse   . Urinary urgency   . Vitamin D deficiency     Patient Active Problem List   Diagnosis Date Noted  . Hx of adenomatous colonic polyps 06/19/2019  . Proctitis, radiation - vascular ectasia 06/12/2019  . Hyperlipidemia 10/13/2018  . Malignant neoplasm of prostate ([place]) 05/25/2017  . Chest pain 08/28/2016  . Electrocardiogram suggestive of ST elevation myocardial infarction (STEMI) ([place]) 08/28/2016  . Pericarditis     Past Surgical History:  Procedure Laterality Date  . DENTAL SURGERY     all teeth removed/ has complete  denture  . LEFT HEART CATH AND CORONARY ANGIOGRAPHY N/A 08/28/2016   Procedure: Left Heart Cath and Coronary Angiography;  Surgeon: Burnell Blanks, MD;  Location: [facility];  Service: Cardiovascular;  Laterality: N/A;  . PROSTATE BIOPSY    . WISDOM TOOTH EXTRACTION         Family History  Problem Relation Age of Onset  . Hypertension Mother   . Arthritis Mother   . Stroke Father   . Heart disease Father   . Diabetes Sister   . Cancer Maternal Grandfather        prostate   . Colon cancer Neg Hx   . Liver cancer Neg Hx   . Colon polyps Neg Hx   . Esophageal cancer Neg Hx   . Rectal cancer Neg Hx   . Stomach cancer Neg Hx     Social History   Tobacco Use  . Smoking status: Current Every Day Smoker    Packs/day: 0.25    Years: 35.00    Pack years: 8.75    Types: Cigarettes    Last attempt to quit: 10/10/2018    Years since quitting: 0.7  . Smokeless tobacco: Never Used  . Tobacco comment: down to three daily   Substance Use Topics  . Alcohol use: Yes    Comment: seldomly- holidays/birthdays  . Drug use: Yes    Frequency: 2.0 times per  week    Types: Marijuana    Comment: smokes marijuana 2-3 times a week to help with pain/stiffness in the mornings.     Home Medications Prior to Admission medications   Medication Sig Start Date End Date Taking? Authorizing Provider  Ascorbic Acid (VITAMIN C) 1000 MG tablet Take 1,000 mg by mouth daily.     [provider]  ibuprofen (ADVIL) 600 MG tablet Take 1 tablet (600 mg total) by mouth every 6 (six) hours as needed. 04/16/19   Aaron Ripple, MD  Multiple Vitamins-Minerals (CENTRUM PO) Take 1 tablet by mouth daily.    [provider]    Allergies    Patient has no known allergies.  Review of Systems   Review of Systems  Respiratory: Negative for shortness of breath.   Cardiovascular: Negative for chest pain.  Musculoskeletal: Positive for back pain and myalgias.  Skin: Positive for  color change and wound.  Neurological: Negative for dizziness, syncope, facial asymmetry, weakness, numbness and headaches.   Physical Exam Updated Vital Signs BP 114/80 (BP Location: Right Arm)   Pulse (!) 102   Temp 98.2 F (36.8 C) (Oral)   Resp 16   Ht 6' 2.5" (1.892 m)   Wt 105.7 kg   SpO2 96%   BMI 29.52 kg/m   Physical Exam Vitals and nursing note reviewed.  Constitutional:      General: He is not in acute distress.    Appearance: Normal appearance. He is normal weight. He is not ill-appearing, toxic-appearing or diaphoretic.  HENT:     Head: Normocephalic and atraumatic.     Right Ear: External ear normal.     Left Ear: External ear normal.     Nose: Nose normal.     Mouth/Throat:     Pharynx: Oropharynx is clear.  Eyes:     General: No scleral icterus.       Right eye: No discharge.        Left eye: No discharge.     Extraocular Movements: Extraocular movements intact.     Conjunctiva/sclera: Conjunctivae normal.     Pupils: Pupils are equal, round, and reactive to light.  Neck:     Comments: No midline C, T, L-spine tenderness. Cardiovascular:     Rate and Rhythm: Normal rate and regular rhythm.     Pulses: Normal pulses.     Heart sounds: Normal heart sounds. No murmur. No friction rub. No gallop.   Pulmonary:     Effort: Pulmonary effort is normal. No respiratory distress.     Breath sounds: Normal breath sounds. No stridor. No wheezing, rhonchi or rales.     Comments: No anterior chest wall tenderness. Chest:     Chest wall: No tenderness.  Abdominal:     General: Abdomen is flat.     Palpations: Abdomen is soft.     Tenderness: There is no abdominal tenderness.  Musculoskeletal:        General: Tenderness present. No swelling. Normal range of motion.     Cervical back: Normal range of motion and neck supple. No tenderness.     Right lower leg: No edema.     Left lower leg: No edema.  Skin:    General: Skin is warm and dry.     Comments: Moderate  abrasions but no active bleeding noted to the right thoracic and lumbar regions.  Moderate TTP overlying the sites.  Additional mild TTP noted diffusely over the right thoracic and lumbar paraspinal regions.  No midline spine pain.  Neurological:     General: No focal deficit present.     Mental Status: He is alert and oriented to person, place, and time.     Comments: Patient is oriented to person, place, time.  Patient speaks in clear and complete sentences.  Finger-to-nose intact bilaterally with no visible signs of ataxia.  Negative pronator drift.  Patient can ambulate with a steady gait.  Distal sensation intact.  Strength is 5 out of 5 in all 4 extremities.  Psychiatric:        Mood and Affect: Mood normal.        Behavior: Behavior normal.    ED Results / Procedures / Treatments   Labs (all labs ordered are listed, but only abnormal results are displayed) Labs Reviewed - No data to display  EKG None  Radiology DG Thoracic Spine 2 View  Result Date: 07/24/2019 CLINICAL DATA:  Thoracic spine pain after fall today. EXAM: THORACIC SPINE 2 VIEWS COMPARISON:  None. FINDINGS: There is no evidence of thoracic spine fracture. Alignment is normal. No other significant bone abnormalities are identified. IMPRESSION: Negative. Electronically Signed   By: Marijo Conception M.D.   On: 07/24/2019 13:58   DG Lumbar Spine Complete  Result Date: 07/24/2019 CLINICAL DATA:  Acute low back pain after fall today. EXAM: LUMBAR SPINE - COMPLETE 4+ VIEW COMPARISON:  None. FINDINGS: No fracture or spondylolisthesis is noted. Disc spaces are well-maintained. Degenerative changes are seen involving the posterior facet joints of L5-S1 bilaterally. IMPRESSION: Degenerative changes as described above. No acute abnormality seen in the lumbar spine. Electronically Signed   By: Marijo Conception M.D.   On: 07/24/2019 13:59    Procedures Procedures   Medications Ordered in ED Medications - No data to display  ED  Course  I have reviewed the triage vital signs and the nursing notes.  Pertinent labs & imaging results that were available during my care of the patient were reviewed by me and considered in my medical decision making (see chart for details).    MDM Rules/Calculators/A&P                      Patient is a 64 year old male who presents today after a fall in the supermarket.  Physical exam is extremely reassuring.  Neurological exam is benign.  No midline spine pain.  He has some small abrasions along the right thoracic and lumbar regions.  We discussed not imaging is back today but patient insisted.  Images were obtained and were negative for any acute abnormalities in the thoracic or lumbar spine.  I discussed Tylenol and Aleve for management of his pain.  Application of ice as needed.  I gave him a short course of Flexeril.  He understands not to mix this medication with alcohol or operate a motor vehicle after taking it.  His questions were answered and he was amicable to time of discharge.  He was given strict return precautions and knows to return the emergency department with any new or worsening symptoms.  His vital signs are stable.  Patient discharged to home/self care.  Condition at discharge: Stable  Note: Portions of this report may have been transcribed using voice recognition software. Every effort was made to ensure accuracy; however, inadvertent computerized transcription errors may be present.    Final Clinical Impression(s) / ED Diagnoses Final diagnoses:  Abrasions of multiple sites  Fall, initial encounter    Rx / DC  Orders ED Discharge Orders         Ordered    cyclobenzaprine (FLEXERIL) 10 MG tablet  Daily at bedtime     07/24/19 1415           Rayna Sexton, PA-C 07/24/19 1431    Lacretia Leigh, MD 07/25/19 [phone]

## 2019-07-24 NOTE — ED Triage Notes (Signed)
Patient was at a grocery store and slipped on a wet spot on the floor. When patient fell he hit a shelf. Patient has abrasions to the right flank and mid back area. Patient denies hitting his head or having LOC.

## 2019-07-24 NOTE — Discharge Instructions (Addendum)
Per our discussion, I am going to prescribe you a new medication of Flexeril.  This is a strong muscle relaxer that has a sedating effect.  Please do not mix with alcohol or operate a motor vehicle after taking it.  You are going to take this as needed once at night for pain as well as difficulty sleeping secondary to pain.  You can continue to take Tylenol and Aleve as needed for your pain.  I would just follow the instructions on the bottles.  Please ice the region that is bothering you.  If your symptoms worsen please do not hesitate to return the emergency department.  It was a pleasure to meet you.

## 2019-09-11 DIAGNOSIS — C61 Malignant neoplasm of prostate: Secondary | ICD-10-CM | POA: Diagnosis not present

## 2019-10-03 DIAGNOSIS — E782 Mixed hyperlipidemia: Secondary | ICD-10-CM | POA: Diagnosis not present

## 2019-10-03 DIAGNOSIS — E663 Overweight: Secondary | ICD-10-CM | POA: Diagnosis not present

## 2019-10-03 DIAGNOSIS — M25559 Pain in unspecified hip: Secondary | ICD-10-CM | POA: Diagnosis not present

## 2019-10-03 DIAGNOSIS — Z87891 Personal history of nicotine dependence: Secondary | ICD-10-CM | POA: Diagnosis not present

## 2019-10-03 DIAGNOSIS — R7303 Prediabetes: Secondary | ICD-10-CM | POA: Diagnosis not present

## 2019-10-03 DIAGNOSIS — I10 Essential (primary) hypertension: Secondary | ICD-10-CM | POA: Diagnosis not present

## 2019-10-03 DIAGNOSIS — E559 Vitamin D deficiency, unspecified: Secondary | ICD-10-CM | POA: Diagnosis not present

## 2019-10-16 ENCOUNTER — Encounter: Payer: Medicare HMO | Admitting: Nurse Practitioner

## 2019-10-18 ENCOUNTER — Ambulatory Visit: Payer: Medicare HMO | Admitting: Nurse Practitioner

## 2019-10-18 DIAGNOSIS — C775 Secondary and unspecified malignant neoplasm of intrapelvic lymph nodes: Secondary | ICD-10-CM | POA: Diagnosis not present

## 2019-10-18 DIAGNOSIS — C61 Malignant neoplasm of prostate: Secondary | ICD-10-CM | POA: Diagnosis not present

## 2019-10-23 ENCOUNTER — Ambulatory Visit: Payer: Medicare HMO

## 2019-10-25 DIAGNOSIS — C61 Malignant neoplasm of prostate: Secondary | ICD-10-CM | POA: Diagnosis not present

## 2019-10-25 DIAGNOSIS — K08109 Complete loss of teeth, unspecified cause, unspecified class: Secondary | ICD-10-CM | POA: Diagnosis not present

## 2019-10-25 DIAGNOSIS — Z809 Family history of malignant neoplasm, unspecified: Secondary | ICD-10-CM | POA: Diagnosis not present

## 2019-10-25 DIAGNOSIS — R030 Elevated blood-pressure reading, without diagnosis of hypertension: Secondary | ICD-10-CM | POA: Diagnosis not present

## 2019-10-25 DIAGNOSIS — N529 Male erectile dysfunction, unspecified: Secondary | ICD-10-CM | POA: Diagnosis not present

## 2019-10-25 DIAGNOSIS — R32 Unspecified urinary incontinence: Secondary | ICD-10-CM | POA: Diagnosis not present

## 2019-10-25 DIAGNOSIS — R69 Illness, unspecified: Secondary | ICD-10-CM | POA: Diagnosis not present

## 2020-04-17 DIAGNOSIS — C775 Secondary and unspecified malignant neoplasm of intrapelvic lymph nodes: Secondary | ICD-10-CM | POA: Diagnosis not present

## 2020-04-17 DIAGNOSIS — N522 Drug-induced erectile dysfunction: Secondary | ICD-10-CM | POA: Diagnosis not present

## 2020-04-17 DIAGNOSIS — C61 Malignant neoplasm of prostate: Secondary | ICD-10-CM | POA: Diagnosis not present

## 2020-04-17 DIAGNOSIS — N401 Enlarged prostate with lower urinary tract symptoms: Secondary | ICD-10-CM | POA: Diagnosis not present

## 2020-04-17 DIAGNOSIS — R3121 Asymptomatic microscopic hematuria: Secondary | ICD-10-CM | POA: Diagnosis not present

## 2020-04-17 DIAGNOSIS — R351 Nocturia: Secondary | ICD-10-CM | POA: Diagnosis not present

## 2020-05-07 DIAGNOSIS — E663 Overweight: Secondary | ICD-10-CM | POA: Diagnosis not present

## 2020-05-07 DIAGNOSIS — Z008 Encounter for other general examination: Secondary | ICD-10-CM | POA: Diagnosis not present

## 2020-05-07 DIAGNOSIS — R69 Illness, unspecified: Secondary | ICD-10-CM | POA: Diagnosis not present

## 2020-05-07 DIAGNOSIS — N529 Male erectile dysfunction, unspecified: Secondary | ICD-10-CM | POA: Diagnosis not present

## 2020-05-07 DIAGNOSIS — Z6828 Body mass index (BMI) 28.0-28.9, adult: Secondary | ICD-10-CM | POA: Diagnosis not present

## 2020-05-07 DIAGNOSIS — R030 Elevated blood-pressure reading, without diagnosis of hypertension: Secondary | ICD-10-CM | POA: Diagnosis not present

## 2020-05-07 DIAGNOSIS — Z8546 Personal history of malignant neoplasm of prostate: Secondary | ICD-10-CM | POA: Diagnosis not present

## 2020-05-07 DIAGNOSIS — Z833 Family history of diabetes mellitus: Secondary | ICD-10-CM | POA: Diagnosis not present

## 2023-11-26 ENCOUNTER — Encounter: Payer: Self-pay | Admitting: Gastroenterology
